# Patient Record
Sex: Female | Born: 1950 | Race: White | Hispanic: No | Smoking: Current every day smoker
Health system: Southern US, Community
[De-identification: ages and names within clinical notes are randomized; demographics above are authoritative.]

## PROBLEM LIST (undated history)

## (undated) DIAGNOSIS — F1910 Other psychoactive substance abuse, uncomplicated: Secondary | ICD-10-CM

## (undated) DIAGNOSIS — E785 Hyperlipidemia, unspecified: Secondary | ICD-10-CM

## (undated) DIAGNOSIS — I2510 Atherosclerotic heart disease of native coronary artery without angina pectoris: Secondary | ICD-10-CM

## (undated) DIAGNOSIS — I10 Essential (primary) hypertension: Secondary | ICD-10-CM

## (undated) DIAGNOSIS — Z720 Tobacco use: Secondary | ICD-10-CM

---

## 2002-02-11 ENCOUNTER — Emergency Department (HOSPITAL_COMMUNITY): Admission: EM | Admit: 2002-02-11 | Discharge: 2002-02-11 | Payer: Self-pay | Admitting: Emergency Medicine

## 2022-04-27 ENCOUNTER — Other Ambulatory Visit (HOSPITAL_COMMUNITY)
Admission: EM | Admit: 2022-04-27 | Discharge: 2022-05-02 | Disposition: A | Discharge status: Home / Self Care | Payer: Medicare Other | Attending: Psychiatry | Admitting: Psychiatry

## 2022-04-27 DIAGNOSIS — Z20822 Contact with and (suspected) exposure to covid-19: Secondary | ICD-10-CM | POA: Insufficient documentation

## 2022-04-27 DIAGNOSIS — F321 Major depressive disorder, single episode, moderate: Secondary | ICD-10-CM | POA: Insufficient documentation

## 2022-04-27 DIAGNOSIS — F1510 Other stimulant abuse, uncomplicated: Secondary | ICD-10-CM | POA: Diagnosis not present

## 2022-04-27 DIAGNOSIS — F1590 Other stimulant use, unspecified, uncomplicated: Secondary | ICD-10-CM | POA: Diagnosis not present

## 2022-04-27 DIAGNOSIS — F329 Major depressive disorder, single episode, unspecified: Secondary | ICD-10-CM | POA: Diagnosis present

## 2022-04-27 DIAGNOSIS — Z79899 Other long term (current) drug therapy: Secondary | ICD-10-CM | POA: Diagnosis not present

## 2022-04-27 LAB — CBC WITH DIFFERENTIAL/PLATELET
Abs Immature Granulocytes: 0.02 10*3/uL (ref 0.00–0.07)
Basophils Absolute: 0.1 10*3/uL (ref 0.0–0.1)
Basophils Relative: 1 %
Eosinophils Absolute: 0.2 10*3/uL (ref 0.0–0.5)
Eosinophils Relative: 2 %
HCT: 35.2 % — ABNORMAL LOW (ref 36.0–46.0)
Hemoglobin: 11.9 g/dL — ABNORMAL LOW (ref 12.0–15.0)
Immature Granulocytes: 0 %
Lymphocytes Relative: 31 %
Lymphs Abs: 3.1 10*3/uL (ref 0.7–4.0)
MCH: 30.1 pg (ref 26.0–34.0)
MCHC: 33.8 g/dL (ref 30.0–36.0)
MCV: 89.1 fL (ref 80.0–100.0)
Monocytes Absolute: 0.8 10*3/uL (ref 0.1–1.0)
Monocytes Relative: 8 %
Neutro Abs: 5.8 10*3/uL (ref 1.7–7.7)
Neutrophils Relative %: 58 %
Platelets: 394 10*3/uL (ref 150–400)
RBC: 3.95 MIL/uL (ref 3.87–5.11)
RDW: 13 % (ref 11.5–15.5)
WBC: 10 10*3/uL (ref 4.0–10.5)
nRBC: 0 % (ref 0.0–0.2)

## 2022-04-27 LAB — COMPREHENSIVE METABOLIC PANEL
ALT: 14 U/L (ref 0–44)
AST: 22 U/L (ref 15–41)
Albumin: 4.1 g/dL (ref 3.5–5.0)
Alkaline Phosphatase: 91 U/L (ref 38–126)
Anion gap: 7 (ref 5–15)
BUN: 23 mg/dL (ref 8–23)
CO2: 25 mmol/L (ref 22–32)
Calcium: 9.2 mg/dL (ref 8.9–10.3)
Chloride: 107 mmol/L (ref 98–111)
Creatinine, Ser: 0.63 mg/dL (ref 0.44–1.00)
GFR, Estimated: >60 mL/min (ref >60)
Glucose, Bld: 88 mg/dL (ref 70–99)
Potassium: 3.9 mmol/L (ref 3.5–5.1)
Sodium: 139 mmol/L (ref 135–145)
Total Bilirubin: <0.1 mg/dL — ABNORMAL LOW (ref 0.3–1.2)
Total Protein: 7 g/dL (ref 6.5–8.1)

## 2022-04-27 LAB — POCT URINE DRUG SCREEN - MANUAL ENTRY (I-SCREEN)
POC Amphetamine UR: POSITIVE — AB
POC Buprenorphine (BUP): NOT DETECTED
POC Cocaine UR: NOT DETECTED
POC Marijuana UR: POSITIVE — AB
POC Methadone UR: NOT DETECTED
POC Methamphetamine UR: POSITIVE — AB
POC Morphine: NOT DETECTED
POC Oxazepam (BZO): NOT DETECTED
POC Oxycodone UR: NOT DETECTED
POC Secobarbital (BAR): NOT DETECTED

## 2022-04-27 LAB — RESP PANEL BY RT-PCR (FLU A&B, COVID) ARPGX2
Influenza A by PCR: NEGATIVE
Influenza B by PCR: NEGATIVE
SARS Coronavirus 2 by RT PCR: NEGATIVE

## 2022-04-27 LAB — POC SARS CORONAVIRUS 2 AG: SARSCOV2ONAVIRUS 2 AG: NEGATIVE

## 2022-04-27 LAB — HEMOGLOBIN A1C
Hgb A1c MFr Bld: 5.7 % — ABNORMAL HIGH (ref 4.8–5.6)
Mean Plasma Glucose: 116.89 mg/dL

## 2022-04-27 LAB — ETHANOL: Alcohol, Ethyl (B): <10 mg/dL (ref <10)

## 2022-04-27 MED ORDER — TRAZODONE HCL 50 MG PO TABS
50.0000 mg | ORAL_TABLET | Freq: Every evening | ORAL | Status: DC | PRN
  Administered 2022-04-28: 50 mg via ORAL
  Filled 2022-04-27: qty 1

## 2022-04-27 MED ORDER — ALUM & MAG HYDROXIDE-SIMETH 200-200-20 MG/5ML PO SUSP: 30.0000 mL | ORAL | Status: DC | PRN

## 2022-04-27 MED ORDER — ACETAMINOPHEN 325 MG PO TABS
650.0000 mg | ORAL_TABLET | Freq: Four times a day (QID) | ORAL | Status: DC | PRN
  Administered 2022-04-28: 650 mg via ORAL
  Filled 2022-04-27: qty 2

## 2022-04-27 MED ORDER — MAGNESIUM HYDROXIDE 400 MG/5ML PO SUSP: 30.0000 mL | Freq: Every day | ORAL | Status: DC | PRN

## 2022-04-27 MED ORDER — HYDROXYZINE HCL 25 MG PO TABS
25.0000 mg | ORAL_TABLET | Freq: Three times a day (TID) | ORAL | Status: DC | PRN
  Administered 2022-04-28 – 2022-04-30 (×6): 25 mg via ORAL
  Filled 2022-04-27 (×6): qty 1
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
## 2022-04-27 NOTE — ED Notes (Signed)
Sandwich, cheez its, and OJ given per pt request. Pending PCR COVID to transfer to [facility].
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
## 2022-04-28 ENCOUNTER — Other Ambulatory Visit: Payer: Self-pay

## 2022-04-28 DIAGNOSIS — F321 Major depressive disorder, single episode, moderate: Secondary | ICD-10-CM | POA: Diagnosis not present

## 2022-04-28 LAB — URINALYSIS, ROUTINE W REFLEX MICROSCOPIC
Bilirubin Urine: NEGATIVE
Glucose, UA: NEGATIVE mg/dL
Hgb urine dipstick: NEGATIVE
Ketones, ur: NEGATIVE mg/dL
Leukocytes,Ua: NEGATIVE
Nitrite: NEGATIVE
Protein, ur: NEGATIVE mg/dL
Specific Gravity, Urine: 1.013 (ref 1.005–1.030)
pH: 6 (ref 5.0–8.0)

## 2022-04-28 LAB — LIPID PANEL
Cholesterol: 133 mg/dL (ref 0–200)
HDL: 91 mg/dL (ref >40)
LDL Cholesterol: 25 mg/dL (ref 0–99)
Total CHOL/HDL Ratio: 1.5 RATIO
Triglycerides: 86 mg/dL (ref <150)
VLDL: 17 mg/dL (ref 0–40)

## 2022-04-28 LAB — TSH: TSH: 0.724 u[IU]/mL (ref 0.350–4.500)

## 2022-04-28 MED ORDER — AMLODIPINE BESYLATE 5 MG PO TABS
5.0000 mg | ORAL_TABLET | Freq: Every day | ORAL | Status: DC
  Administered 2022-04-28 – 2022-04-29 (×2): 5 mg via ORAL
  Filled 2022-04-28 (×2): qty 1

## 2022-04-28 MED ORDER — NICOTINE 21 MG/24HR TD PT24
21.0000 mg | MEDICATED_PATCH | Freq: Every day | TRANSDERMAL | Status: DC
  Administered 2022-04-28 – 2022-05-02 (×5): 21 mg via TRANSDERMAL
  Filled 2022-04-28 (×5): qty 1

## 2022-04-28 MED ORDER — DULOXETINE HCL 30 MG PO CPEP
30.0000 mg | ORAL_CAPSULE | Freq: Every day | ORAL | Status: DC
  Administered 2022-04-28 – 2022-04-29 (×2): 30 mg via ORAL
  Filled 2022-04-28 (×2): qty 1

## 2022-04-28 NOTE — Progress Notes (Signed)
Patient awake and alert on unit.  Dysphoric and mildly irritable.  Patient is eating a snack and watching TV at present.  Seen by provider and will begin cymbalta.  Patient denies signs or symptoms of withdrawal.  She was given a cup for UA and is aware of the need for a sample.  Will monitor and provide a safe environment.

## 2022-04-28 NOTE — ED Notes (Signed)
Pt sitting in dining room watching TV. A&O x4, calm and cooperative. Denies SI/HI/AVH. Denies any current needs. No signs of acute distress noted. Will continue to monitor for safety.  ?

## 2022-04-28 NOTE — Progress Notes (Signed)
Patients B/P elevated with automatic reading = 202/110  Provider Vernard Gambles here and informed.  Retaken manually 170/98 provider aware and to order b/p medication for her.  Sh complained of a headache and 650mg  tylenol PO PRN given.  Patient denies blurred vision or chest pain.

## 2022-04-28 NOTE — ED Notes (Signed)
Pt asleep in bed. Respirations even and unlabored. Will continue to monitor for safety. ?

## 2022-04-28 NOTE — Progress Notes (Signed)
Received [place] this AM asleep in her bed, she woke up on her own, ate breakfast and returned to bed. Later she got up and requested to speak with the provider related hearing voices. She was given Vistaril related to anxiety.

## 2022-04-28 NOTE — ED Notes (Signed)
Pt admitted  to [facility] substance abuse, depression. Patient  denies SI,HI but endorses hearing voices of her son telling her things that mekes her feel bad. Patient was cooperative during the admission assessment. Skin assessment complete. Belongings inventoried. Patient oriented to unit and unit rules. Meal and drinks offered to patient.  Patient verbalized agreement to treatment plans. Patient verbally contracts for safety while hospitalized. Will monitor for safety.

## 2022-04-28 NOTE — ED Notes (Signed)
Pt BP 158/72, Pulse 90. Provider notified. Will continue to monitor for safety.
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
## 2022-04-28 NOTE — ED Notes (Addendum)
Patient refused group, gave her booklet to look over in her spare time, The title is Geophysicist/field seismologist with workbook, it covers how do we grow, Identifying needs, Choosing a valued Oriented Life, Self Esteem Coping Skills it all comes with work books.

## 2022-04-29 ENCOUNTER — Encounter (HOSPITAL_COMMUNITY): Payer: Self-pay

## 2022-04-29 ENCOUNTER — Encounter (HOSPITAL_COMMUNITY): Payer: Self-pay | Admitting: Physician Assistant

## 2022-04-29 DIAGNOSIS — F321 Major depressive disorder, single episode, moderate: Secondary | ICD-10-CM | POA: Diagnosis not present

## 2022-04-29 MED ORDER — TRAZODONE HCL 100 MG PO TABS
100.0000 mg | ORAL_TABLET | Freq: Every evening | ORAL | Status: DC | PRN
  Administered 2022-04-29 – 2022-05-01 (×3): 100 mg via ORAL
  Filled 2022-04-29 (×3): qty 1

## 2022-04-29 MED ORDER — AMLODIPINE BESYLATE 10 MG PO TABS
10.0000 mg | ORAL_TABLET | Freq: Every day | ORAL | Status: DC
  Administered 2022-04-30 – 2022-05-02 (×3): 10 mg via ORAL
  Filled 2022-04-29 (×3): qty 1

## 2022-04-29 MED ORDER — DULOXETINE HCL 60 MG PO CPEP
60.0000 mg | ORAL_CAPSULE | Freq: Every day | ORAL | Status: DC
  Administered 2022-04-30 – 2022-05-02 (×3): 60 mg via ORAL
  Filled 2022-04-29 (×3): qty 1

## 2022-04-29 MED ORDER — DULOXETINE HCL 30 MG PO CPEP
30.0000 mg | ORAL_CAPSULE | Freq: Once | ORAL | Status: AC
  Administered 2022-04-29: 30 mg via ORAL
  Filled 2022-04-29: qty 1

## 2022-04-29 MED ORDER — IBUPROFEN 600 MG PO TABS
600.0000 mg | ORAL_TABLET | Freq: Four times a day (QID) | ORAL | Status: DC | PRN
  Administered 2022-04-29 – 2022-04-30 (×3): 600 mg via ORAL
  Filled 2022-04-29 (×3): qty 1
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
## 2022-04-29 NOTE — BH IP Treatment Plan (Addendum)
Interdisciplinary Treatment and Diagnostic Plan Update  05/01/2022 Time of Session: 10:00AM  Shannon Burgess MRN: [id number]  Diagnosis:  Final diagnoses:  Current moderate episode of major depressive disorder without prior episode (HCC)  Methamphetamine use (HCC)     Current Medications:  Current Facility-Administered Medications  Medication Dose Route Frequency Provider Last Rate Last Admin   acetaminophen (TYLENOL) tablet 650 mg  650 mg Oral Q6H PRN Nwoko, Uchenna E, PA   650 mg at 04/28/22 1801   alum & mag hydroxide-simeth (MAALOX/MYLANTA) 200-200-20 MG/5ML suspension 30 mL  30 mL Oral Q4H PRN Nwoko, Uchenna E, PA       amLODipine (NORVASC) tablet 10 mg  10 mg Oral Daily Vernard Gambles H, NP   10 mg at 05/01/22 0855   DULoxetine (CYMBALTA) DR capsule 60 mg  60 mg Oral Daily Ardis Hughs, NP   60 mg at 05/01/22 0856   hydrOXYzine (ATARAX) tablet 25 mg  25 mg Oral TID PRN Nwoko, Uchenna E, PA   25 mg at 04/30/22 2107   ibuprofen (ADVIL) tablet 600 mg  600 mg Oral Q6H PRN Ardis Hughs, NP   600 mg at 04/30/22 2107   magnesium hydroxide (MILK OF MAGNESIA) suspension 30 mL  30 mL Oral Daily PRN Nwoko, Uchenna E, PA       nicotine (NICODERM CQ - dosed in mg/24 hours) patch 21 mg  21 mg Transdermal Daily Vernard Gambles H, NP   21 mg at 05/01/22 0900   risperiDONE (RISPERDAL) tablet 2 mg  2 mg Oral BID Mariel Craft, MD       traZODone (DESYREL) tablet 100 mg  100 mg Oral QHS PRN Ardis Hughs, NP   100 mg at 04/30/22 2104   No current outpatient medications on file.   PTA Medications: Prior to Admission medications   Not on File    Patient Stressors: Financial difficulties   Substance abuse   Other: Homeless    Patient Strengths: Supportive family/friends   Treatment Modalities: Medication Management, Group therapy, Case management,  1 to 1 session with clinician, Psychoeducation, Recreational therapy.   Physician Treatment Plan for Primary and Secondary  Diagnosis:  Final diagnoses:  Current moderate episode of major depressive disorder without prior episode (HCC)  Methamphetamine use (HCC)   Long Term Goal(s): Improvement in symptoms so as ready for discharge had resources for outpatient psychiatry and therapy.  Patient would also benefit from receiving resources for substance abuse treatment.  Short Term Goals: Ability to identify changes in lifestyle to reduce recurrence of condition will improve Ability to verbalize feelings will improve Ability to demonstrate self-control will improve Ability to identify and develop effective coping behaviors will improve Ability to maintain clinical measurements within normal limits will improve Compliance with prescribed medications will improve Ability to identify triggers associated with substance abuse/mental health issues will improve  Medication Management: Evaluate patient's response, side effects, and tolerance of medication regimen.  Therapeutic Interventions: 1 to 1 sessions, Unit Group sessions and Medication administration.  Evaluation of Outcomes: Progressing  RN Treatment Plan for Primary Diagnosis:  Final diagnoses:  Current moderate episode of major depressive disorder without prior episode (HCC)  Methamphetamine use (HCC)    Long Term Goal(s): Knowledge of disease and therapeutic regimen to maintain health will improve  Short Term Goals: Ability to identify and develop effective coping behaviors will improve  Medication Management: RN will administer medications as ordered by provider, will assess and evaluate patient's response and provide education  to patient for prescribed medication. RN will report any adverse and/or side effects to prescribing provider.  Therapeutic Interventions: 1 on 1 counseling sessions, Psychoeducation, Medication administration, Evaluate responses to treatment, Monitor vital signs and CBGs as ordered, Perform/monitor CIWA, COWS, AIMS and Fall Risk  screenings as ordered, Perform wound care treatments as ordered.  Evaluation of Outcomes: Progressing   LCSW Treatment Plan for Primary Diagnosis:  Final diagnoses:  Current moderate episode of major depressive disorder without prior episode (HCC)  Methamphetamine use (HCC)    Long Term Goal(s): Safe transition to appropriate next level of care at discharge, Engage patient in therapeutic group addressing interpersonal concerns.  Short Term Goals: Engage patient in aftercare planning with referrals and resources  Therapeutic Interventions: Assess for all discharge needs, 1 to 1 time with Social worker, Explore available resources and support systems, Assess for adequacy in community support network, Educate family and significant other(s) on suicide prevention, Complete Psychosocial Assessment, Interpersonal group therapy.  Evaluation of Outcomes: Adequate for Discharge   Progress in Treatment: Attending groups: No. Participating in groups: No. Taking medication as prescribed: Yes. Toleration medication: Yes. Family/Significant other contact made: No, will contact:  No one Patient understands diagnosis: Yes. Discussing patient identified problems/goals with staff: Yes. Medical problems stabilized or resolved: Yes. Denies suicidal/homicidal ideation: Yes. Issues/concerns per patient self-inventory: No. Other: None  New problem(s) identified: None   New Short Term/Long Term Goal(s): To be determined  Patient Goals:  "I dont really know right now. I just want to get clean and get this stuff out of me"  Discharge Plan or Barriers: To be determined; LCSW will continue to follow and assess.   Reason for Continuation of Hospitalization: Medication stabilization Withdrawal symptoms  Estimated Length of Stay: 3-5 days  Last 3 [place] Suicide Severity Risk Score: Flowsheet Row ED from 04/27/2022 in [facility]  C-SSRS RISK CATEGORY No Risk        Last [facility] 2/9 Scores:    04/28/2022    1:05 PM  Depression screen PHQ 2/9  Decreased Interest 3  Down, Depressed, Hopeless 3  PHQ - 2 Score 6  Altered sleeping 3  Tired, decreased energy 3  Change in appetite 3  Feeling bad or failure about yourself  3  Trouble concentrating 3  Moving slowly or fidgety/restless 3  Suicidal thoughts 0  PHQ-9 Score 24  Difficult doing work/chores Extremely dIfficult    Scribe for Treatment Team: Maeola Sarah, LCSW 05/01/2022 4:04 PM

## 2022-04-29 NOTE — Progress Notes (Signed)
Patient is restless with muscle aches and irritable mood.  Patient seeking additional medication to manage symptoms.  Provider met with patient and discussed options.  No new orders as of this time.  Patient has limited insight into addiction and recovery.  Resistant to learning about above.  Patient denies avh shi at this time.  Will monitor and provide safe environment.

## 2022-04-29 NOTE — ED Provider Notes (Signed)
Behavioral Health Progress Note  Date and Time: 04/29/2022 9:14 PM Name: Shannon Burgess MRN:  [id number]  Subjective:    Shannon Burgess 72 y.o., female patient presented to [facility] with increased depression and methamphetamine use.  She was admitted to the facility based crisis unit.    Shannon Burgess, 71 y.o., female patient seen face to face by this provider, consulted with Dr. Lucianne Muss; and chart reviewed on 04/29/22.  Upon assessment UDS positive for amphetamines, methamphetamines, and marijuana.  BAL negative.  On today's reevaluation patient is observed walking to the day room.  She is seen eating.  She is alert/oriented and cooperative.  Her speech and behavior is normal.  Reports she only slept 2-3 hours last night and is requesting medications for sleep.  Reports she took the trazodone last night but it was not effective.  She denies any concerns with appetite.  She continues to endorse depression with a congruent affect.  She denies SI/HI/AVH.  She continues to hear conversations replay in her mind that she and her son had, she calls these hallucinations.  She endorses withdrawal symptoms which include, chills, hot flashes, restlessness, and muscle/joint pain.  She rates her pain as an 8 out of 10.  Discussed increasing Cymbalta from 30 mg to 60 mg/day, added ibuprofen 600 mg every 6 hours as needed for pain, increase trazodone to 100 mg nightly as needed and increased Norvasc to 10 mg daily for elevated blood pressure.  Educated patient on medications and she is in agreement.    Diagnosis:  Final diagnoses:  Current moderate episode of major depressive disorder without prior episode (HCC)  Methamphetamine use (HCC)    Total Time spent with patient: 30 minutes  Past Psychiatric History: See H&P Past Medical History: No past medical history on file. The histories are not reviewed yet. Please review them in the "History" navigator section and refresh this SmartLink. Family History: No family  history on file. Family Psychiatric  History: See H&P Social History:  Social History   Substance and Sexual Activity  Alcohol Use Not on file     Social History   Substance and Sexual Activity  Drug Use Not on file    Social History   Socioeconomic History   Marital status: Divorced    Spouse name: Not on file   Number of children: Not on file   Years of education: Not on file   Highest education level: Not on file  Occupational History   Not on file  Tobacco Use   Smoking status: Not on file   Smokeless tobacco: Not on file  Substance and Sexual Activity   Alcohol use: Not on file   Drug use: Not on file   Sexual activity: Not on file  Other Topics Concern   Not on file  Social History Narrative   Not on file   Social Determinants of Health   Financial Resource Strain: Not on file  Food Insecurity: Not on file  Transportation Needs: Not on file  Physical Activity: Not on file  Stress: Not on file  Social Connections: Not on file   SDOH:  SDOH Screenings   Alcohol Screen: Not on file  Depression (PHQ2-9): Medium Risk   PHQ-2 Score: 24  Financial Resource Strain: Not on file  Food Insecurity: Not on file  Housing: Not on file  Physical Activity: Not on file  Social Connections: Not on file  Stress: Not on file  Tobacco Use: Not on file  Transportation Needs:  Not on file   Additional Social History:    Pain Medications: See MAR Prescriptions: See MAR Over the Counter: See MAR History of alcohol / drug use?: Yes Name of Substance 1: Amphetamines. 1 - Age of First Use: UTA 1 - Amount (size/oz): Pt reports, "I dont know." 1 - Frequency: Ongoing. 1 - Duration: Ongoing. 1 - Last Use / Amount: Yesterday. 1 - Method of Aquiring: Purchase. 1- Route of Use: UTA Name of Substance 2: Marijuana. 2 - Age of First Use: UTA 2 - Amount (size/oz): UTA 2 - Frequency: Ongoing. 2 - Duration: Daily. 2 - Last Use / Amount: Daily. 2 - Method of Aquiring:  Purchase. 2 - Route of Substance Use: Oral                Sleep: Poor  Appetite:  Fair  Current Medications:  Current Facility-Administered Medications  Medication Dose Route Frequency Provider Last Rate Last Admin   acetaminophen (TYLENOL) tablet 650 mg  650 mg Oral Q6H PRN Nwoko, Uchenna E, PA   650 mg at 04/28/22 1801   alum & mag hydroxide-simeth (MAALOX/MYLANTA) 200-200-20 MG/5ML suspension 30 mL  30 mL Oral Q4H PRN Nwoko, Uchenna E, PA       [START ON 04/30/2022] amLODipine (NORVASC) tablet 10 mg  10 mg Oral Daily Ardis Hughs, NP       [START ON 04/30/2022] DULoxetine (CYMBALTA) DR capsule 60 mg  60 mg Oral Daily Ardis Hughs, NP       hydrOXYzine (ATARAX) tablet 25 mg  25 mg Oral TID PRN Nwoko, Uchenna E, PA   25 mg at 04/29/22 0931   ibuprofen (ADVIL) tablet 600 mg  600 mg Oral Q6H PRN Ardis Hughs, NP   600 mg at 04/29/22 1314   magnesium hydroxide (MILK OF MAGNESIA) suspension 30 mL  30 mL Oral Daily PRN Nwoko, Uchenna E, PA       nicotine (NICODERM CQ - dosed in mg/24 hours) patch 21 mg  21 mg Transdermal Daily Vernard Gambles H, NP   21 mg at 04/29/22 0931   traZODone (DESYREL) tablet 100 mg  100 mg Oral QHS PRN Ardis Hughs, NP       No current outpatient medications on file.    Labs  Lab Results:  Admission on 04/27/2022  Component Date Value Ref Range Status   SARS Coronavirus 2 by RT PCR 04/27/2022 NEGATIVE  NEGATIVE Final   Comment: (NOTE) SARS-CoV-2 target nucleic acids are NOT DETECTED.  The SARS-CoV-2 RNA is generally detectable in upper respiratory specimens during the acute phase of infection. The lowest concentration of SARS-CoV-2 viral copies this assay can detect is 138 copies/mL. A negative result does not preclude SARS-Cov-2 infection and should not be used as the sole basis for treatment or other patient management decisions. A negative result may occur with  improper specimen collection/handling, submission of specimen  other than nasopharyngeal swab, presence of viral mutation(s) within the areas targeted by this assay, and inadequate number of viral copies(<138 copies/mL). A negative result must be combined with clinical observations, patient history, and epidemiological information. The expected result is Negative.  Fact Sheet for Patients:  BloggerCourse.com  Fact Sheet for Healthcare Providers:  SeriousBroker.it  This test is no                          t yet approved or cleared by the [place] and  has been authorized  for detection and/or diagnosis of SARS-CoV-2 by FDA under an Emergency Use Authorization (EUA). This EUA will remain  in effect (meaning this test can be used) for the duration of the COVID-19 declaration under Section 564(b)(1) of the Act, 21 U.S.C.section 360bbb-3(b)(1), unless the authorization is terminated  or revoked sooner.       Influenza A by PCR 04/27/2022 NEGATIVE  NEGATIVE Final   Influenza B by PCR 04/27/2022 NEGATIVE  NEGATIVE Final   Comment: (NOTE) The Xpert Xpress SARS-CoV-2/FLU/RSV plus assay is intended as an aid in the diagnosis of influenza from Nasopharyngeal swab specimens and should not be used as a sole basis for treatment. Nasal washings and aspirates are unacceptable for Xpert Xpress SARS-CoV-2/FLU/RSV testing.  Fact Sheet for Patients: BloggerCourse.com  Fact Sheet for Healthcare Providers: SeriousBroker.it  This test is not yet approved or cleared by the [place] FDA and has been authorized for detection and/or diagnosis of SARS-CoV-2 by FDA under an Emergency Use Authorization (EUA). This EUA will remain in effect (meaning this test can be used) for the duration of the COVID-19 declaration under Section 564(b)(1) of the Act, 21 U.S.C. section 360bbb-3(b)(1), unless the authorization is terminated or revoked.  Performed at  [facility], [address], [place], [place] [address]    WBC 04/27/2022 10.0  4.0 - 10.5 K/uL Final   RBC 04/27/2022 3.95  3.87 - 5.11 MIL/uL Final   Hemoglobin 04/27/2022 11.9 (L)  12.0 - 15.0 g/dL Final   HCT 60/45/4098 35.2 (L)  36.0 - 46.0 % Final   MCV 04/27/2022 89.1  80.0 - 100.0 fL Final   MCH 04/27/2022 30.1  26.0 - 34.0 pg Final   MCHC 04/27/2022 33.8  30.0 - 36.0 g/dL Final   RDW 11/91/4782 13.0  11.5 - 15.5 % Final   Platelets 04/27/2022 394  150 - 400 K/uL Final   nRBC 04/27/2022 0.0  0.0 - 0.2 % Final   Neutrophils Relative % 04/27/2022 58  % Final   Neutro Abs 04/27/2022 5.8  1.7 - 7.7 K/uL Final   Lymphocytes Relative 04/27/2022 31  % Final   Lymphs Abs 04/27/2022 3.1  0.7 - 4.0 K/uL Final   Monocytes Relative 04/27/2022 8  % Final   Monocytes Absolute 04/27/2022 0.8  0.1 - 1.0 K/uL Final   Eosinophils Relative 04/27/2022 2  % Final   Eosinophils Absolute 04/27/2022 0.2  0.0 - 0.5 K/uL Final   Basophils Relative 04/27/2022 1  % Final   Basophils Absolute 04/27/2022 0.1  0.0 - 0.1 K/uL Final   Immature Granulocytes 04/27/2022 0  % Final   Abs Immature Granulocytes 04/27/2022 0.02  0.00 - 0.07 K/uL Final   Performed at [facility], [address], [place], [place] [address]   Sodium 04/27/2022 139  135 - 145 mmol/L Final   Potassium 04/27/2022 3.9  3.5 - 5.1 mmol/L Final   Chloride 04/27/2022 107  98 - 111 mmol/L Final   CO2 04/27/2022 25  22 - 32 mmol/L Final   Glucose, Bld 04/27/2022 88  70 - 99 mg/dL Final   Glucose reference range applies only to samples taken after fasting for at least 8 hours.   BUN 04/27/2022 23  8 - 23 mg/dL Final   Creatinine, Ser 04/27/2022 0.63  0.44 - 1.00 mg/dL Final   Calcium 30/86/5784 9.2  8.9 - 10.3 mg/dL Final   Total Protein 69/62/9528 7.0  6.5 - 8.1 g/dL Final   Albumin 41/32/4401 4.1  3.5 - 5.0 g/dL Final   AST 16/09/9603 22  15 - 41 U/L Final   ALT 04/27/2022 14  0 - 44 U/L Final   Alkaline Phosphatase  04/27/2022 91  38 - 126 U/L Final   Total Bilirubin 04/27/2022 <0.1 (L)  0.3 - 1.2 mg/dL Final   GFR, Estimated 04/27/2022 >60  >60 mL/min Final   Comment: (NOTE) Calculated using the CKD-EPI Creatinine Equation (2021)    Anion gap 04/27/2022 7  5 - 15 Final   Performed at [facility], [address] [place], [place] [address]   Hgb A1c MFr Bld 04/27/2022 5.7 (H)  4.8 - 5.6 % Final   Comment: (NOTE) Pre diabetes:          5.7%-6.4%  Diabetes:              >6.4%  Glycemic control for   <7.0% adults with diabetes    Mean Plasma Glucose 04/27/2022 116.89  mg/dL Final   Performed at [facility], [address] [place], [place] [address]   Alcohol, Ethyl (B) 04/27/2022 <10  <10 mg/dL Final   Comment: (NOTE) Lowest detectable limit for serum alcohol is 10 mg/dL.  For medical purposes only. Performed at [facility], [address] [place], [place] [address]    Cholesterol 04/27/2022 133  0 - 200 mg/dL Final   Triglycerides 62/13/0865 86  <150 mg/dL Final   HDL 78/46/9629 91  >40 mg/dL Final   Total CHOL/HDL Ratio 04/27/2022 1.5  RATIO Final   VLDL 04/27/2022 17  0 - 40 mg/dL Final   LDL Cholesterol 04/27/2022 25  0 - 99 mg/dL Final   Comment:        Total Cholesterol/HDL:CHD Risk Coronary Heart Disease Risk Table                     Men   Women  1/2 Average Risk   3.4   3.3  Average Risk       5.0   4.4  2 X Average Risk   9.6   7.1  3 X Average Risk  23.4   11.0        Use the calculated Patient Ratio above and the CHD Risk Table to determine the patient's CHD Risk.        ATP III CLASSIFICATION (LDL):  <100     mg/dL   Optimal  528-413  mg/dL   Near or Above                    Optimal  130-159  mg/dL   Borderline  244-010  mg/dL   High  >272     mg/dL   Very High Performed at [facility], [address], [place], [place] [address]    TSH 04/27/2022 0.724  0.350 - 4.500 uIU/mL Final   Comment: Performed by a 3rd Generation assay with a  functional sensitivity of <=0.01 uIU/mL. Performed at [facility], [address] [place], [place] [address]    POC Amphetamine UR 04/27/2022 Positive (A)  NONE DETECTED (Cut Off Level 1000 ng/mL) Final   POC Secobarbital (BAR) 04/27/2022 None Detected  NONE DETECTED (Cut Off Level 300 ng/mL) Final   POC Buprenorphine (BUP) 04/27/2022 None Detected  NONE DETECTED (Cut Off Level 10 ng/mL) Final   POC Oxazepam (BZO) 04/27/2022 None Detected  NONE DETECTED (Cut Off Level 300 ng/mL) Final  POC Cocaine UR 04/27/2022 None Detected  NONE DETECTED (Cut Off Level 300 ng/mL) Final   POC Methamphetamine UR 04/27/2022 Positive (A)  NONE DETECTED (Cut Off Level 1000 ng/mL) Final   POC Morphine 04/27/2022 None Detected  NONE DETECTED (Cut Off Level 300 ng/mL) Final   POC Methadone UR 04/27/2022 None Detected  NONE DETECTED (Cut Off Level 300 ng/mL) Final   POC Oxycodone UR 04/27/2022 None Detected  NONE DETECTED (Cut Off Level 100 ng/mL) Final   POC Marijuana UR 04/27/2022 Positive (A)  NONE DETECTED (Cut Off Level 50 ng/mL) Final   SARSCOV2ONAVIRUS 2 AG 04/27/2022 NEGATIVE  NEGATIVE Final   Comment: (NOTE) SARS-CoV-2 antigen NOT DETECTED.   Negative results are presumptive.  Negative results do not preclude SARS-CoV-2 infection and should not be used as the sole basis for treatment or other patient management decisions, including infection  control decisions, particularly in the presence of clinical signs and  symptoms consistent with COVID-19, or in those who have been in contact with the virus.  Negative results must be combined with clinical observations, patient history, and epidemiological information. The expected result is Negative.  Fact Sheet for Patients: https://www.jennings-kim.com/  Fact Sheet for Healthcare Providers: https://alexander-rogers.biz/  This test is not yet approved or cleared by the [place] FDA and  has been authorized for detection  and/or diagnosis of SARS-CoV-2 by FDA under an Emergency Use Authorization (EUA).  This EUA will remain in effect (meaning this test can be used) for the duration of  the COV                          ID-19 declaration under Section 564(b)(1) of the Act, 21 U.S.C. section 360bbb-3(b)(1), unless the authorization is terminated or revoked sooner.     Color, Urine 04/28/2022 STRAW (A)  YELLOW Final   APPearance 04/28/2022 CLEAR  CLEAR Final   Specific Gravity, Urine 04/28/2022 1.013  1.005 - 1.030 Final   pH 04/28/2022 6.0  5.0 - 8.0 Final   Glucose, UA 04/28/2022 NEGATIVE  NEGATIVE mg/dL Final   Hgb urine dipstick 04/28/2022 NEGATIVE  NEGATIVE Final   Bilirubin Urine 04/28/2022 NEGATIVE  NEGATIVE Final   Ketones, ur 04/28/2022 NEGATIVE  NEGATIVE mg/dL Final   Protein, ur 10/93/2355 NEGATIVE  NEGATIVE mg/dL Final   Nitrite 73/22/0254 NEGATIVE  NEGATIVE Final   Leukocytes,Ua 04/28/2022 NEGATIVE  NEGATIVE Final   Performed at [facility], [address], [place], [place] [address]    Blood Alcohol level:  Lab Results  Component Value Date   ETH <10 04/27/2022    Metabolic Disorder Labs: Lab Results  Component Value Date   HGBA1C 5.7 (H) 04/27/2022   MPG 116.89 04/27/2022   No results found for: PROLACTIN Lab Results  Component Value Date   CHOL 133 04/27/2022   TRIG 86 04/27/2022   HDL 91 04/27/2022   CHOLHDL 1.5 04/27/2022   VLDL 17 04/27/2022   LDLCALC 25 04/27/2022    Therapeutic Lab Levels: No results found for: LITHIUM No results found for: VALPROATE No components found for:  CBMZ  Physical Findings   PHQ2-9    Flowsheet Row ED from 04/27/2022 in [facility]  PHQ-2 Total Score 6  PHQ-9 Total Score 24      Flowsheet Row ED from 04/27/2022 in [facility]  C-SSRS RISK CATEGORY Error: Q3, 4, or 5 should not be populated when Q2 is No  Musculoskeletal  Strength & Muscle Tone: within  normal limits Gait & Station: normal Patient leans: N/A  Psychiatric Specialty Exam  Presentation  General Appearance: Appropriate for Environment; Casual  Eye Contact:Good  Speech:Clear and Coherent; Normal Rate  Speech Volume:Normal  Handedness:Right   Mood and Affect  Mood:Anxious; Depressed  Affect:Congruent   Thought Process  Thought Processes:Coherent  Descriptions of Associations:Intact  Orientation:Full (Time, Place and Person)  Thought Content:Logical     Hallucinations:Hallucinations: Auditory Description of Auditory Hallucinations: hears sons voice and conversations replaying they have had  Ideas of Reference:None  Suicidal Thoughts:Suicidal Thoughts: No  Homicidal Thoughts:Homicidal Thoughts: No   Sensorium  Memory:Immediate Good; Remote Good; Recent Good  Judgment:Fair  Insight:Fair   Executive Functions  Concentration:Good  Attention Span:Good  Recall:Good  Fund of Knowledge:Good  Language:Good   Psychomotor Activity  Psychomotor Activity:Psychomotor Activity: Normal  Assets  Assets:Communication Skills; Desire for Improvement; Financial Resources/Insurance; Leisure Time; Physical Health; Resilience   Sleep  Sleep:Sleep: Poor Number of Hours of Sleep: 2  No data recorded  Physical Exam  Physical Exam Vitals and nursing note reviewed.  Constitutional:      General: She is not in acute distress.    Appearance: Normal appearance. She is not ill-appearing.  HENT:     Head: Normocephalic.  Eyes:     General:        Right eye: No discharge.        Left eye: No discharge.     Conjunctiva/sclera: Conjunctivae normal.  Cardiovascular:     Rate and Rhythm: Normal rate.  Pulmonary:     Effort: Pulmonary effort is normal.  Musculoskeletal:        General: Normal range of motion.     Cervical back: Normal range of motion.  Skin:    Capillary Refill: Capillary refill takes more than 3 seconds.     Coloration: Skin is not  jaundiced or pale.  Neurological:     Mental Status: She is alert and oriented to person, place, and time.  Psychiatric:        Attention and Perception: Attention normal. She perceives auditory hallucinations.        Mood and Affect: Mood is anxious and depressed. Affect is tearful.        Speech: Speech normal.        Behavior: Behavior is cooperative.        Thought Content: Thought content normal.        Cognition and Memory: Cognition normal.        Judgment: Judgment is impulsive.   Review of Systems  Constitutional: Negative.   HENT: Negative.    Eyes: Negative.   Respiratory: Negative.    Cardiovascular: Negative.   Musculoskeletal: Negative.   Skin: Negative.   Neurological: Negative.   Psychiatric/Behavioral:  Positive for depression, hallucinations and substance abuse. The patient is nervous/anxious.   Blood pressure (!) 162/98, pulse 93, temperature 99.4 F (37.4 C), temperature source Oral, resp. rate 18, SpO2 100 %. There is no height or weight on file to calculate BMI.  Treatment Plan Summary: Disposition: Patient continues to meet criteria for treatment in the [facility].  We will continue to have daily contact with patient to assess and evaluate symptoms and progress in treatment and Medication management.   Increased  Cymbalta from 30 mg to 60 mg/day, added ibuprofen 600 mg every 6 hours as needed for pain, increase trazodone to 100 mg nightly as needed and increased Norvasc to 10 mg daily for  elevated blood pressure.  Educated patient on medications and she is in agreement.  Ardis Hughsarolyn H Chardonnay Holzmann, NP 04/29/2022 9:14 PM

## 2022-04-29 NOTE — ED Notes (Signed)
Patient is resting in bed. She states that she is very worried about where she will go when she leaves here. She does not have a home and has no money. She hope she can go to a facility that accepts medicare. Will continue to monitor for safety.

## 2022-04-29 NOTE — ED Notes (Signed)
Pt asleep in bed. Respirations even and unlabored. Will continue to monitor for safety. ?

## 2022-04-29 NOTE — ED Notes (Signed)
Notified pt that breakfast is ready 

## 2022-04-30 DIAGNOSIS — F321 Major depressive disorder, single episode, moderate: Secondary | ICD-10-CM | POA: Diagnosis not present

## 2022-04-30 MED ORDER — RISPERIDONE 1 MG PO TABS
1.0000 mg | ORAL_TABLET | Freq: Two times a day (BID) | ORAL | Status: DC
Start: 2022-04-30 — End: 2022-05-01
  Administered 2022-04-30 – 2022-05-01 (×3): 1 mg via ORAL
  Filled 2022-04-30 (×3): qty 1

## 2022-04-30 NOTE — ED Notes (Signed)
Received patient this PM. Patient is sleeping in her bed. Patient respirations are even and unlabored. Will continue to monitor for safety.  

## 2022-04-30 NOTE — ED Notes (Signed)
Patient continues to rest with no sxs of distress - will continue to monitor for safety ?

## 2022-04-30 NOTE — ED Notes (Signed)
Pt asked for cereal, milk,cookie and peanut butter given

## 2022-04-30 NOTE — ED Notes (Signed)
Patient resting quietly with eyes closed. No S/S of distress, respirations even and unlabored. Will continue to monitor for safety.

## 2022-04-30 NOTE — Progress Notes (Signed)
Shannon Burgess woke up for dinner, ate and received her medications per order.

## 2022-04-30 NOTE — Progress Notes (Addendum)
Received Shannon Burgess this AM asleep in her room, she was awaken for breakfast and medication. She returned to her room after each event. She endorsed feeling anxious and depressed with intervals of panic attacks. She was medicated with Vistaril. She denied feeling suicidal.

## 2022-04-30 NOTE — ED Provider Notes (Signed)
Behavioral Health Progress Note  Date and Time: 04/30/2022 11:33 AM Name: Shannon Burgess MRN:  [id number]  Subjective:    Shannon Burgess 71 y.o., female patient presented to [facility] with increased depression and methamphetamine use.  She was admitted to the facility based crisis unit.    Shannon Burgess, 71 y.o., female patient seen face to face by this provider.  Chart is reviewed on 04/30/22. Upon initial assessment UDS positive for amphetamines, methamphetamines, and marijuana.  BAL negative.  On today's reevaluation patient is observed in her room.  She remains in bed. She is alert/oriented and cooperative.  Her behavior is normal.  She endorses poor sleep but good appetite.  She describes body aches and headaches from methamphetamine withdrawal. She endorses withdrawal symptoms which include, chills, hot flashes, restlessness, and muscle/joint pain.  She rates her pain as an 8 out of 10.She continues to endorse depression with a congruent affect.  She denies SI/HI/AVH.  She continues to hear conversations replay in her mind that she and her son had, she calls these hallucinations.  She states that she continues to feel depressed, but is optimistic that things will improve.  She continues to endorse auditory hallucinations of her son telling her he hates her and to go to the shelter.  She states that this worsens her depression, as she was "helping him pay off his bills, and I was eating out of dumpsters."  Patient endorses 70 pound weight loss during this time.   Medication changes on 04/29/2022: Cymbalta from 30 mg to 60 mg/day, added ibuprofen 600 mg every 6 hours as needed for pain, increase trazodone to 100 mg nightly as needed and increased Norvasc to 10 mg daily for elevated blood pressure.   04/30/2022: Discussed with patient starting Risperdal 1 mg twice daily for auditory hallucinations.  Patient is educated on R/B/SE and is agreeable with treatment.    Diagnosis:  Final diagnoses:  Current  moderate episode of major depressive disorder without prior episode (HCC)  Methamphetamine use (HCC)   Total Time Spent in Direct Patient Care:  I personally spent 35 minutes on the unit in direct patient care. The direct patient care time included face-to-face time with the patient, reviewing the patient's chart, communicating with other professionals, and coordinating care. Greater than 50% of this time was spent in counseling or coordinating care with the patient regarding goals of hospitalization, psycho-education, and discharge planning needs.  Past Psychiatric History: See H&P Past Medical History: No past medical history on file. The histories are not reviewed yet. Please review them in the "History" navigator section and refresh this SmartLink. Family History: No family history on file. Family Psychiatric  History: See H&P Social History:  Social History   Substance and Sexual Activity  Alcohol Use Not on file     Social History   Substance and Sexual Activity  Drug Use Not on file    Social History   Socioeconomic History   Marital status: Divorced    Spouse name: Not on file   Number of children: Not on file   Years of education: Not on file   Highest education level: Not on file  Occupational History   Not on file  Tobacco Use   Smoking status: Not on file   Smokeless tobacco: Not on file  Substance and Sexual Activity   Alcohol use: Not on file   Drug use: Not on file   Sexual activity: Not on file  Other Topics Concern   Not  on file  Social History Narrative   Not on file   Social Determinants of Health   Financial Resource Strain: Not on file  Food Insecurity: Not on file  Transportation Needs: Not on file  Physical Activity: Not on file  Stress: Not on file  Social Connections: Not on file   SDOH:  SDOH Screenings   Alcohol Screen: Not on file  Depression (PHQ2-9): Medium Risk   PHQ-2 Score: 24  Financial Resource Strain: Not on file  Food  Insecurity: Not on file  Housing: Not on file  Physical Activity: Not on file  Social Connections: Not on file  Stress: Not on file  Tobacco Use: Not on file  Transportation Needs: Not on file   Additional Social History:    Pain Medications: See MAR Prescriptions: See MAR Over the Counter: See MAR History of alcohol / drug use?: Yes Name of Substance 1: Amphetamines. 1 - Age of First Use: UTA 1 - Amount (size/oz): Pt reports, "I dont know." 1 - Frequency: Ongoing. 1 - Duration: Ongoing. 1 - Last Use / Amount: Yesterday. 1 - Method of Aquiring: Purchase. 1- Route of Use: UTA Name of Substance 2: Marijuana. 2 - Age of First Use: UTA 2 - Amount (size/oz): UTA 2 - Frequency: Ongoing. 2 - Duration: Daily. 2 - Last Use / Amount: Daily. 2 - Method of Aquiring: Purchase. 2 - Route of Substance Use: Oral                Sleep: Poor  Appetite:  Fair  Current Medications:  Current Facility-Administered Medications  Medication Dose Route Frequency Provider Last Rate Last Admin   acetaminophen (TYLENOL) tablet 650 mg  650 mg Oral Q6H PRN Nwoko, Uchenna E, PA   650 mg at 04/28/22 1801   alum & mag hydroxide-simeth (MAALOX/MYLANTA) 200-200-20 MG/5ML suspension 30 mL  30 mL Oral Q4H PRN Nwoko, Uchenna E, PA       amLODipine (NORVASC) tablet 10 mg  10 mg Oral Daily Vernard Gambles H, NP   10 mg at 04/30/22 0920   DULoxetine (CYMBALTA) DR capsule 60 mg  60 mg Oral Daily Ardis Hughs, NP   60 mg at 04/30/22 0920   hydrOXYzine (ATARAX) tablet 25 mg  25 mg Oral TID PRN Nwoko, Uchenna E, PA   25 mg at 04/29/22 2119   ibuprofen (ADVIL) tablet 600 mg  600 mg Oral Q6H PRN Ardis Hughs, NP   600 mg at 04/29/22 2118   magnesium hydroxide (MILK OF MAGNESIA) suspension 30 mL  30 mL Oral Daily PRN Nwoko, Uchenna E, PA       nicotine (NICODERM CQ - dosed in mg/24 hours) patch 21 mg  21 mg Transdermal Daily Vernard Gambles H, NP   21 mg at 04/30/22 0919   risperiDONE (RISPERDAL)  tablet 1 mg  1 mg Oral BID Mariel Craft, MD       traZODone (DESYREL) tablet 100 mg  100 mg Oral QHS PRN Ardis Hughs, NP   100 mg at 04/29/22 2118   No current outpatient medications on file.    Labs  Lab Results:  Admission on 04/27/2022  Component Date Value Ref Range Status   SARS Coronavirus 2 by RT PCR 04/27/2022 NEGATIVE  NEGATIVE Final   Comment: (NOTE) SARS-CoV-2 target nucleic acids are NOT DETECTED.  The SARS-CoV-2 RNA is generally detectable in upper respiratory specimens during the acute phase of infection. The lowest concentration of SARS-CoV-2 viral copies this  assay can detect is 138 copies/mL. A negative result does not preclude SARS-Cov-2 infection and should not be used as the sole basis for treatment or other patient management decisions. A negative result may occur with  improper specimen collection/handling, submission of specimen other than nasopharyngeal swab, presence of viral mutation(s) within the areas targeted by this assay, and inadequate number of viral copies(<138 copies/mL). A negative result must be combined with clinical observations, patient history, and epidemiological information. The expected result is Negative.  Fact Sheet for Patients:  BloggerCourse.comhttps://www.fda.gov/media/152166/download  Fact Sheet for Healthcare Providers:  SeriousBroker.ithttps://www.fda.gov/media/152162/download  This test is no                          t yet approved or cleared by the [place] FDA and  has been authorized for detection and/or diagnosis of SARS-CoV-2 by FDA under an Emergency Use Authorization (EUA). This EUA will remain  in effect (meaning this test can be used) for the duration of the COVID-19 declaration under Section 564(b)(1) of the Act, 21 U.S.C.section 360bbb-3(b)(1), unless the authorization is terminated  or revoked sooner.       Influenza A by PCR 04/27/2022 NEGATIVE  NEGATIVE Final   Influenza B by PCR 04/27/2022 NEGATIVE  NEGATIVE Final    Comment: (NOTE) The Xpert Xpress SARS-CoV-2/FLU/RSV plus assay is intended as an aid in the diagnosis of influenza from Nasopharyngeal swab specimens and should not be used as a sole basis for treatment. Nasal washings and aspirates are unacceptable for Xpert Xpress SARS-CoV-2/FLU/RSV testing.  Fact Sheet for Patients: BloggerCourse.comhttps://www.fda.gov/media/152166/download  Fact Sheet for Healthcare Providers: SeriousBroker.ithttps://www.fda.gov/media/152162/download  This test is not yet approved or cleared by the [place] FDA and has been authorized for detection and/or diagnosis of SARS-CoV-2 by FDA under an Emergency Use Authorization (EUA). This EUA will remain in effect (meaning this test can be used) for the duration of the COVID-19 declaration under Section 564(b)(1) of the Act, 21 U.S.C. section 360bbb-3(b)(1), unless the authorization is terminated or revoked.  Performed at [facility], [address] [place], [place] [id number]    WBC 04/27/2022 10.0  4.0 - 10.5 K/uL Final   RBC 04/27/2022 3.95  3.87 - 5.11 MIL/uL Final   Hemoglobin 04/27/2022 11.9 (L)  12.0 - 15.0 g/dL Final   HCT 60/45/409805/20/2023 35.2 (L)  36.0 - 46.0 % Final   MCV 04/27/2022 89.1  80.0 - 100.0 fL Final   MCH 04/27/2022 30.1  26.0 - 34.0 pg Final   MCHC 04/27/2022 33.8  30.0 - 36.0 g/dL Final   RDW 11/91/478205/20/2023 13.0  11.5 - 15.5 % Final   Platelets 04/27/2022 394  150 - 400 K/uL Final   nRBC 04/27/2022 0.0  0.0 - 0.2 % Final   Neutrophils Relative % 04/27/2022 58  % Final   Neutro Abs 04/27/2022 5.8  1.7 - 7.7 K/uL Final   Lymphocytes Relative 04/27/2022 31  % Final   Lymphs Abs 04/27/2022 3.1  0.7 - 4.0 K/uL Final   Monocytes Relative 04/27/2022 8  % Final   Monocytes Absolute 04/27/2022 0.8  0.1 - 1.0 K/uL Final   Eosinophils Relative 04/27/2022 2  % Final   Eosinophils Absolute 04/27/2022 0.2  0.0 - 0.5 K/uL Final   Basophils Relative 04/27/2022 1  % Final   Basophils Absolute 04/27/2022 0.1  0.0 - 0.1 K/uL Final    Immature Granulocytes 04/27/2022 0  % Final   Abs Immature Granulocytes 04/27/2022 0.02  0.00 - 0.07 K/uL Final   Performed at [facility], [address], [place], [place] [address]   Sodium 04/27/2022 139  135 - 145 mmol/L Final   Potassium 04/27/2022 3.9  3.5 - 5.1 mmol/L Final   Chloride 04/27/2022 107  98 - 111 mmol/L Final   CO2 04/27/2022 25  22 - 32 mmol/L Final   Glucose, Bld 04/27/2022 88  70 - 99 mg/dL Final   Glucose reference range applies only to samples taken after fasting for at least 8 hours.   BUN 04/27/2022 23  8 - 23 mg/dL Final   Creatinine, Ser 04/27/2022 0.63  0.44 - 1.00 mg/dL Final   Calcium 60/45/4098 9.2  8.9 - 10.3 mg/dL Final   Total Protein 11/91/4782 7.0  6.5 - 8.1 g/dL Final   Albumin 95/62/1308 4.1  3.5 - 5.0 g/dL Final   AST 65/78/4696 22  15 - 41 U/L Final   ALT 04/27/2022 14  0 - 44 U/L Final   Alkaline Phosphatase 04/27/2022 91  38 - 126 U/L Final   Total Bilirubin 04/27/2022 <0.1 (L)  0.3 - 1.2 mg/dL Final   GFR, Estimated 04/27/2022 >60  >60 mL/min Final   Comment: (NOTE) Calculated using the CKD-EPI Creatinine Equation (2021)    Anion gap 04/27/2022 7  5 - 15 Final   Performed at [facility], [address] [place], [place] [address]   Hgb A1c MFr Bld 04/27/2022 5.7 (H)  4.8 - 5.6 % Final   Comment: (NOTE) Pre diabetes:          5.7%-6.4%  Diabetes:              >6.4%  Glycemic control for   <7.0% adults with diabetes    Mean Plasma Glucose 04/27/2022 116.89  mg/dL Final   Performed at [facility], [address] [place], [place] [address]   Alcohol, Ethyl (B) 04/27/2022 <10  <10 mg/dL Final   Comment: (NOTE) Lowest detectable limit for serum alcohol is 10 mg/dL.  For medical purposes only. Performed at [facility], [address], [place], [place] [address]    Cholesterol 04/27/2022 133  0 - 200 mg/dL Final   Triglycerides 72/53/6644 86  <150 mg/dL Final   HDL 03/47/4259 91  >40 mg/dL Final   Total  CHOL/HDL Ratio 04/27/2022 1.5  RATIO Final   VLDL 04/27/2022 17  0 - 40 mg/dL Final   LDL Cholesterol 04/27/2022 25  0 - 99 mg/dL Final   Comment:        Total Cholesterol/HDL:CHD Risk Coronary Heart Disease Risk Table                     Men   Women  1/2 Average Risk   3.4   3.3  Average Risk       5.0   4.4  2 X Average Risk   9.6   7.1  3 X Average Risk  23.4   11.0        Use the calculated Patient Ratio above and the CHD Risk Table to determine the patient's CHD Risk.        ATP III CLASSIFICATION (LDL):  <100     mg/dL   Optimal  563-875  mg/dL   Near or Above                    Optimal  130-159  mg/dL  Borderline  160-189  mg/dL   High  >749     mg/dL   Very High Performed at [facility], [address], [place], [place] [address]    TSH 04/27/2022 0.724  0.350 - 4.500 uIU/mL Final   Comment: Performed by a 3rd Generation assay with a functional sensitivity of <=0.01 uIU/mL. Performed at [facility], [address] [place], [place] [address]    POC Amphetamine UR 04/27/2022 Positive (A)  NONE DETECTED (Cut Off Level 1000 ng/mL) Final   POC Secobarbital (BAR) 04/27/2022 None Detected  NONE DETECTED (Cut Off Level 300 ng/mL) Final   POC Buprenorphine (BUP) 04/27/2022 None Detected  NONE DETECTED (Cut Off Level 10 ng/mL) Final   POC Oxazepam (BZO) 04/27/2022 None Detected  NONE DETECTED (Cut Off Level 300 ng/mL) Final   POC Cocaine UR 04/27/2022 None Detected  NONE DETECTED (Cut Off Level 300 ng/mL) Final   POC Methamphetamine UR 04/27/2022 Positive (A)  NONE DETECTED (Cut Off Level 1000 ng/mL) Final   POC Morphine 04/27/2022 None Detected  NONE DETECTED (Cut Off Level 300 ng/mL) Final   POC Methadone UR 04/27/2022 None Detected  NONE DETECTED (Cut Off Level 300 ng/mL) Final   POC Oxycodone UR 04/27/2022 None Detected  NONE DETECTED (Cut Off Level 100 ng/mL) Final   POC Marijuana UR 04/27/2022 Positive (A)  NONE DETECTED (Cut Off Level 50 ng/mL) Final    SARSCOV2ONAVIRUS 2 AG 04/27/2022 NEGATIVE  NEGATIVE Final   Comment: (NOTE) SARS-CoV-2 antigen NOT DETECTED.   Negative results are presumptive.  Negative results do not preclude SARS-CoV-2 infection and should not be used as the sole basis for treatment or other patient management decisions, including infection  control decisions, particularly in the presence of clinical signs and  symptoms consistent with COVID-19, or in those who have been in contact with the virus.  Negative results must be combined with clinical observations, patient history, and epidemiological information. The expected result is Negative.  Fact Sheet for Patients: https://www.jennings-kim.com/  Fact Sheet for Healthcare Providers: https://alexander-rogers.biz/  This test is not yet approved or cleared by the [place] FDA and  has been authorized for detection and/or diagnosis of SARS-CoV-2 by FDA under an Emergency Use Authorization (EUA).  This EUA will remain in effect (meaning this test can be used) for the duration of  the COV                          ID-19 declaration under Section 564(b)(1) of the Act, 21 U.S.C. section 360bbb-3(b)(1), unless the authorization is terminated or revoked sooner.     Color, Urine 04/28/2022 STRAW (A)  YELLOW Final   APPearance 04/28/2022 CLEAR  CLEAR Final   Specific Gravity, Urine 04/28/2022 1.013  1.005 - 1.030 Final   pH 04/28/2022 6.0  5.0 - 8.0 Final   Glucose, UA 04/28/2022 NEGATIVE  NEGATIVE mg/dL Final   Hgb urine dipstick 04/28/2022 NEGATIVE  NEGATIVE Final   Bilirubin Urine 04/28/2022 NEGATIVE  NEGATIVE Final   Ketones, ur 04/28/2022 NEGATIVE  NEGATIVE mg/dL Final   Protein, ur 84/66/5993 NEGATIVE  NEGATIVE mg/dL Final   Nitrite 57/12/7791 NEGATIVE  NEGATIVE Final   Leukocytes,Ua 04/28/2022 NEGATIVE  NEGATIVE Final   Performed at [facility], [address], [place], [place] [address]    Blood Alcohol level:  Lab  Results  Component Value Date   [facility] <10 04/27/2022    Metabolic Disorder Labs: Lab Results  Component Value Date   HGBA1C 5.7 (H) 04/27/2022   MPG 116.89 04/27/2022   No results found for: PROLACTIN Lab Results  Component Value Date   CHOL 133 04/27/2022   TRIG 86 04/27/2022   HDL 91 04/27/2022   CHOLHDL 1.5 04/27/2022   VLDL 17 04/27/2022   LDLCALC 25 04/27/2022    Therapeutic Lab Levels: No results found for: LITHIUM No results found for: VALPROATE No components found for:  CBMZ  Physical Findings   PHQ2-9    Flowsheet Row ED from 04/27/2022 in [facility]  PHQ-2 Total Score 6  PHQ-9 Total Score 24      Flowsheet Row ED from 04/27/2022 in [facility]  C-SSRS RISK CATEGORY Error: Question 2 not populated        Musculoskeletal  Strength & Muscle Tone: within normal limits Gait & Station: normal Patient leans: N/A  Psychiatric Specialty Exam  Presentation  General Appearance: Appropriate for Environment; Casual  Eye Contact:Good  Speech:Clear and Coherent; Normal Rate  Speech Volume:Normal  Handedness:Right   Mood and Affect  Mood:Anxious; Depressed  Affect:Congruent   Thought Process  Thought Processes:Coherent  Descriptions of Associations:Intact  Orientation:Full (Time, Place and Person)  Thought Content:Logical     Hallucinations:Hallucinations: Auditory Description of Auditory Hallucinations: hears sons voice and conversations replaying they have had  Ideas of Reference:None  Suicidal Thoughts:Suicidal Thoughts: No  Homicidal Thoughts:Homicidal Thoughts: No   Sensorium  Memory:Immediate Good; Remote Good; Recent Good  Judgment:Fair  Insight:Fair   Executive Functions  Concentration:Good  Attention Span:Good  Recall:Good  Fund of Knowledge:Good  Language:Good   Psychomotor Activity  Psychomotor Activity:Psychomotor Activity: Normal  Assets   Assets:Communication Skills; Desire for Improvement; Financial Resources/Insurance; Leisure Time; Physical Health; Resilience   Sleep  Sleep:Sleep: Poor Number of Hours of Sleep: 2  No data recorded  Physical Exam  Physical Exam Vitals and nursing note reviewed.  Constitutional:      General: She is not in acute distress.    Appearance: Normal appearance.     Comments: thin  HENT:     Head: Normocephalic.  Cardiovascular:     Rate and Rhythm: Tachycardia present.  Pulmonary:     Effort: Pulmonary effort is normal. No respiratory distress.  Musculoskeletal:        General: Normal range of motion.  Neurological:     General: No focal deficit present.     Mental Status: She is oriented to person, place, and time.  Psychiatric:        Attention and Perception: Attention normal. She perceives auditory hallucinations.        Mood and Affect: Mood is anxious and depressed. Affect is tearful.        Speech: Speech normal.        Behavior: Behavior is cooperative.        Cognition and Memory: Cognition normal.   Review of Systems  Constitutional: Negative.   HENT: Negative.    Eyes: Negative.   Respiratory: Negative.    Cardiovascular: Negative.   Musculoskeletal: Negative.   Skin: Negative.   Neurological: Negative.   Psychiatric/Behavioral:  Positive for depression, hallucinations and substance abuse. Negative for suicidal ideas. The patient is nervous/anxious and has insomnia.   Blood pressure 127/80, pulse (!) 101, temperature 98.7 F (37.1 C), temperature source Tympanic, resp. rate 18, SpO2 100 %. There is no height or weight on file to calculate BMI.  Treatment Plan Summary: Disposition: Patient continues to meet criteria  for treatment in the [facility].  We will continue to have daily contact with patient to assess and evaluate symptoms and progress in treatment and Medication management.   -Continue  Cymbalta 60 mg/day for depression -Continue ibuprofen 600 mg every 6  hours as needed for pain -Continue trazodone 100 mg nightly as needed -Continue Norvasc 10 mg daily for elevated blood pressure.   -Start Risperdal 1 mg BID for auditory hallucinations.   Educated patient on medications and she is in agreement.  Mariel Craft, MD 04/30/2022 11:33 AM

## 2022-04-30 NOTE — Progress Notes (Addendum)
Shannon Burgess got up to eat dinner with her peers, she verbalized feeling better, less anxious, after taking the Vistaril earlier and napping.

## 2022-04-30 NOTE — Clinical Social Work Psych Note (Signed)
LCSW Update   Shannon Burgess shared that she was "possibly interested" in participating in residential treatment. Shannon Burgess reports that she "needs somewhere to stay until June 1st".   Shannon Burgess denied having any SI, HI or AVH at this time, however she does continue to report hearing her son's voice in an "argument style". Shannon Burgess shared that she and her son had a "fallen out" prior to her arrival to the [facility].   Shannon Burgess reports that she and her roommate are planning to sign a lease on a new home on June 1st.   LCSW informed Shannon Burgess that due to her Medicare insurance coverage, that limits current options for residential substance abuse treatment in the area.  LCSW shared that [facility] in [place], [place] does accept various forms of Medicare, however it is to be determined if she would be accepted after a referral has been made.   Shannon Burgess declined the referral due to the facility not being in the [place] area.   [facility] - not appropriate due to payor source; no beds available [facility] - no beds available  [facility] - no beds available  [facility] - no beds available  [facility] - not appropriate due to payor source  Shannon Burgess reports she and her roommate plan to continue to seeking temporary housing. LCSW will continue to follow for alternative placement options.      Baldo Daub, MSW, LCSW Clinical Child psychotherapist (Facility Based Crisis) [facility]

## 2022-05-01 DIAGNOSIS — F321 Major depressive disorder, single episode, moderate: Secondary | ICD-10-CM | POA: Diagnosis not present

## 2022-05-01 MED ORDER — RISPERIDONE 2 MG PO TABS
2.0000 mg | ORAL_TABLET | Freq: Two times a day (BID) | ORAL | Status: DC
  Administered 2022-05-01 – 2022-05-02 (×2): 2 mg via ORAL
  Filled 2022-05-01 (×2): qty 1

## 2022-05-01 MED ORDER — RISPERIDONE 1 MG PO TABS
1.0000 mg | ORAL_TABLET | Freq: Once | ORAL | Status: AC
  Administered 2022-05-01: 1 mg via ORAL
  Filled 2022-05-01: qty 1

## 2022-05-01 NOTE — ED Notes (Signed)
Pt is currently sleeping, no distress noted, environmental check complete, will continue to monitor patient for safety. ? ?

## 2022-05-01 NOTE — ED Notes (Signed)
Patient resting quietly in bed with eyes closed, Respirations equal and unlabored, skin warm and dry, NAD. No change in assessment or acuity. Q 15 minute safety checks remain in place.   

## 2022-05-01 NOTE — ED Notes (Signed)
Patient denies SI, HI, VH. Patient reports AH telling her that he son hates her. Patient is able to contract for safety while on unit. Patient was informed to use deep breathing techniques if she felt overwhelmed or anxious. Respirations equal and unlabored, skin warm and dry. No acute distress noted. Will continue to monitor patient.

## 2022-05-01 NOTE — ED Notes (Addendum)
Patient denies SI, HI , and AVH. Patient compliant with medication administration. See MAR . Patient stated she was anxious and had back pain.  Patient was calm and pleasant. Patient sleeping peacefully. No acute distress noted. Will continue to monitor for safety.

## 2022-05-01 NOTE — ED Notes (Signed)
Notified pt that breakfast is ready 

## 2022-05-01 NOTE — Group Note (Signed)
Group Topic: Understanding Self  Group Date: 05/01/2022 Start Time: 1220 End Time: 1245 Facilitators: Levander Campion  Department: [facility]  Number of Participants: 5  Group Focus: activities of daily living skills, coping skills, and problem solving Treatment Modality:  Behavior Modification Therapy and Solution-Focused Therapy Interventions utilized were patient education Purpose: enhance coping skills and regain self-worth  Name: Rilya Longo Date of Birth: [birth year]  MR: [id number]    Level of Participation: moderate Quality of Participation: attentive Interactions with others: gave feedback Mood/Affect: appropriate Triggers (if applicable): n/a Cognition: coherent/clear Progress: Moderate Response: n/a Plan: follow-up needed  Patients Problems:  Patient Active Problem List   Diagnosis Date Noted   MDD (major depressive disorder) 04/27/2022

## 2022-05-01 NOTE — Clinical Social Work Psych Note (Signed)
LCSW Update Note     Caelen shared that she felt better today. She reports that she slept well and even believe there was a decrease in her reported auditory hallucinations. Audra denied having any SI, HI or VH at this time.   Tama continues to decline any residential treatment services. Johnathon shared that her reasoning for declining residential treatment due to her signing a new lease on July 1st.   LCSW and Dr. Leverne Humbles explained to [facility] that she more than likely will not remain in the [facility] until June 1st for housing. Allisha expressed understanding.   LCSW provided Ioanna with housing resources, including [employer], [employer] and  an [employer]. Reyanna ensured providers that she will follow up with the contacts to finding temporary housing until June 1st.    LCSW will continue to follow.    Radonna Ricker, MSW, LCSW Clinical Education officer, museum ([place]) [facility]

## 2022-05-01 NOTE — ED Provider Notes (Signed)
Behavioral Health Progress Note  Date and Time: 05/01/2022 5:07 PM Name: Shannon Burgess MRN:  [id number]  Subjective:    Shannon Burgess 71 y.o., female patient presented to [facility] with increased depression and methamphetamine use.  She was admitted to the facility based crisis unit.    Shannon Burgess, 71 y.o., female patient seen face to face by this provider.  Chart is reviewed on 05/01/22. Upon initial assessment UDS positive for amphetamines, methamphetamines, and marijuana.  BAL negative.  Patient is seen with LCSW for treatment team. On today's reevaluation patient is observed in her room.  She remains in bed. She is alert/oriented and cooperative.  Her behavior is normal.  She endorses sleeping better and eating okay.  She describes body aches from methamphetamine withdrawal.  Overall withdrawal symptoms are decreasing.  Patient denies suicidal ideation, plan, or intent.  She denies homicidal ideation.  She denies visual hallucinations she reports that her auditory hallucinations have decreased but are still persistent of her son's voice.  Patient is agreeable to increasing dose of Risperdal, noting that it has also seemed to decrease her anxiety as well as improve her sleep.  On reevaluation later in the unit, patient states that her auditory hallucinations have not been noticed since increased dose of Risperdal.   Medication changes on 04/29/2022: Cymbalta from 30 mg to 60 mg/day, added ibuprofen 600 mg every 6 hours as needed for pain, increase trazodone to 100 mg nightly as needed and increased Norvasc to 10 mg daily for elevated blood pressure.   Medication changes on 04/30/2022: Risperdal 1 mg twice daily for auditory hallucinations.      Diagnosis:  Final diagnoses:  Current moderate episode of major depressive disorder without prior episode (HCC)  Methamphetamine use (HCC)   Total Time Spent in Direct Patient Care:  I personally spent 35 minutes on the unit in direct patient care.  The direct patient care time included face-to-face time with the patient, reviewing the patient's chart, communicating with other professionals, and coordinating care. Greater than 50% of this time was spent in counseling or coordinating care with the patient regarding goals of hospitalization, psycho-education, and discharge planning needs.  Past Psychiatric History: See H&P Past Medical History: No past medical history on file. The histories are not reviewed yet. Please review them in the "History" navigator section and refresh this SmartLink. Family History: No family history on file. Family Psychiatric  History: See H&P Social History:  Social History   Substance and Sexual Activity  Alcohol Use Not on file     Social History   Substance and Sexual Activity  Drug Use Not on file    Social History   Socioeconomic History   Marital status: Divorced    Spouse name: Not on file   Number of children: Not on file   Years of education: Not on file   Highest education level: Not on file  Occupational History   Not on file  Tobacco Use   Smoking status: Not on file   Smokeless tobacco: Not on file  Substance and Sexual Activity   Alcohol use: Not on file   Drug use: Not on file   Sexual activity: Not on file  Other Topics Concern   Not on file  Social History Narrative   Not on file   Social Determinants of Health   Financial Resource Strain: Not on file  Food Insecurity: Not on file  Transportation Needs: Not on file  Physical Activity: Not on file  Stress: Not on file  Social Connections: Not on file   SDOH:  SDOH Screenings   Alcohol Screen: Not on file  Depression (PHQ2-9): Medium Risk   PHQ-2 Score: 24  Financial Resource Strain: Not on file  Food Insecurity: Not on file  Housing: Not on file  Physical Activity: Not on file  Social Connections: Not on file  Stress: Not on file  Tobacco Use: Not on file  Transportation Needs: Not on file   Additional  Social History:    Pain Medications: See MAR Prescriptions: See MAR Over the Counter: See MAR History of alcohol / drug use?: Yes Name of Substance 1: Amphetamines. 1 - Age of First Use: UTA 1 - Amount (size/oz): Pt reports, "I dont know." 1 - Frequency: Ongoing. 1 - Duration: Ongoing. 1 - Last Use / Amount: Yesterday. 1 - Method of Aquiring: Purchase. 1- Route of Use: UTA Name of Substance 2: Marijuana. 2 - Age of First Use: UTA 2 - Amount (size/oz): UTA 2 - Frequency: Ongoing. 2 - Duration: Daily. 2 - Last Use / Amount: Daily. 2 - Method of Aquiring: Purchase. 2 - Route of Substance Use: Oral                Sleep: Poor  Appetite:  Fair  Current Medications:  Current Facility-Administered Medications  Medication Dose Route Frequency Provider Last Rate Last Admin   acetaminophen (TYLENOL) tablet 650 mg  650 mg Oral Q6H PRN Nwoko, Uchenna E, PA   650 mg at 04/28/22 1801   alum & mag hydroxide-simeth (MAALOX/MYLANTA) 200-200-20 MG/5ML suspension 30 mL  30 mL Oral Q4H PRN Nwoko, Uchenna E, PA       amLODipine (NORVASC) tablet 10 mg  10 mg Oral Daily Thomes Lolling H, NP   10 mg at 05/01/22 0855   DULoxetine (CYMBALTA) DR capsule 60 mg  60 mg Oral Daily Revonda Humphrey, NP   60 mg at 05/01/22 0856   hydrOXYzine (ATARAX) tablet 25 mg  25 mg Oral TID PRN Nwoko, Uchenna E, PA   25 mg at 04/30/22 2107   ibuprofen (ADVIL) tablet 600 mg  600 mg Oral Q6H PRN Revonda Humphrey, NP   600 mg at 04/30/22 2107   magnesium hydroxide (MILK OF MAGNESIA) suspension 30 mL  30 mL Oral Daily PRN Nwoko, Uchenna E, PA       nicotine (NICODERM CQ - dosed in mg/24 hours) patch 21 mg  21 mg Transdermal Daily Thomes Lolling H, NP   21 mg at 05/01/22 0900   risperiDONE (RISPERDAL) tablet 2 mg  2 mg Oral BID Lavella Hammock, MD       traZODone (DESYREL) tablet 100 mg  100 mg Oral QHS PRN Revonda Humphrey, NP   100 mg at 04/30/22 2104   No current outpatient medications on file.    Labs   Lab Results:  Admission on 04/27/2022  Component Date Value Ref Range Status   SARS Coronavirus 2 by RT PCR 04/27/2022 NEGATIVE  NEGATIVE Final   Comment: (NOTE) SARS-CoV-2 target nucleic acids are NOT DETECTED.  The SARS-CoV-2 RNA is generally detectable in upper respiratory specimens during the acute phase of infection. The lowest concentration of SARS-CoV-2 viral copies this assay can detect is 138 copies/mL. A negative result does not preclude SARS-Cov-2 infection and should not be used as the sole basis for treatment or other patient management decisions. A negative result may occur with  improper specimen collection/handling, submission of specimen other  than nasopharyngeal swab, presence of viral mutation(s) within the areas targeted by this assay, and inadequate number of viral copies(<138 copies/mL). A negative result must be combined with clinical observations, patient history, and epidemiological information. The expected result is Negative.  Fact Sheet for Patients:  EntrepreneurPulse.com.au  Fact Sheet for Healthcare Providers:  IncredibleEmployment.be  This test is no                          t yet approved or cleared by the [place] FDA and  has been authorized for detection and/or diagnosis of SARS-CoV-2 by FDA under an Emergency Use Authorization (EUA). This EUA will remain  in effect (meaning this test can be used) for the duration of the COVID-19 declaration under Section 564(b)(1) of the Act, 21 U.S.C.section 360bbb-3(b)(1), unless the authorization is terminated  or revoked sooner.       Influenza A by PCR 04/27/2022 NEGATIVE  NEGATIVE Final   Influenza B by PCR 04/27/2022 NEGATIVE  NEGATIVE Final   Comment: (NOTE) The Xpert Xpress SARS-CoV-2/FLU/RSV plus assay is intended as an aid in the diagnosis of influenza from Nasopharyngeal swab specimens and should not be used as a sole basis for treatment. Nasal  washings and aspirates are unacceptable for Xpert Xpress SARS-CoV-2/FLU/RSV testing.  Fact Sheet for Patients: EntrepreneurPulse.com.au  Fact Sheet for Healthcare Providers: IncredibleEmployment.be  This test is not yet approved or cleared by the [place] FDA and has been authorized for detection and/or diagnosis of SARS-CoV-2 by FDA under an Emergency Use Authorization (EUA). This EUA will remain in effect (meaning this test can be used) for the duration of the COVID-19 declaration under Section 564(b)(1) of the Act, 21 U.S.C. section 360bbb-3(b)(1), unless the authorization is terminated or revoked.  Performed at [facility], [place] [address] [place], [place] [address]    WBC 04/27/2022 10.0  4.0 - 10.5 K/uL Final   RBC 04/27/2022 3.95  3.87 - 5.11 MIL/uL Final   Hemoglobin 04/27/2022 11.9 (L)  12.0 - 15.0 g/dL Final   HCT 04/27/2022 35.2 (L)  36.0 - 46.0 % Final   MCV 04/27/2022 89.1  80.0 - 100.0 fL Final   MCH 04/27/2022 30.1  26.0 - 34.0 pg Final   MCHC 04/27/2022 33.8  30.0 - 36.0 g/dL Final   RDW 04/27/2022 13.0  11.5 - 15.5 % Final   Platelets 04/27/2022 394  150 - 400 K/uL Final   nRBC 04/27/2022 0.0  0.0 - 0.2 % Final   Neutrophils Relative % 04/27/2022 58  % Final   Neutro Abs 04/27/2022 5.8  1.7 - 7.7 K/uL Final   Lymphocytes Relative 04/27/2022 31  % Final   Lymphs Abs 04/27/2022 3.1  0.7 - 4.0 K/uL Final   Monocytes Relative 04/27/2022 8  % Final   Monocytes Absolute 04/27/2022 0.8  0.1 - 1.0 K/uL Final   Eosinophils Relative 04/27/2022 2  % Final   Eosinophils Absolute 04/27/2022 0.2  0.0 - 0.5 K/uL Final   Basophils Relative 04/27/2022 1  % Final   Basophils Absolute 04/27/2022 0.1  0.0 - 0.1 K/uL Final   Immature Granulocytes 04/27/2022 0  % Final   Abs Immature Granulocytes 04/27/2022 0.02  0.00 - 0.07 K/uL Final   Performed at [facility], [place] [address], [place], [place] [address]   Sodium 04/27/2022  139  135 - 145 mmol/L Final   Potassium 04/27/2022 3.9  3.5 - 5.1 mmol/L Final  Chloride 04/27/2022 107  98 - 111 mmol/L Final   CO2 04/27/2022 25  22 - 32 mmol/L Final   Glucose, Bld 04/27/2022 88  70 - 99 mg/dL Final   Glucose reference range applies only to samples taken after fasting for at least 8 hours.   BUN 04/27/2022 23  8 - 23 mg/dL Final   Creatinine, Ser 04/27/2022 0.63  0.44 - 1.00 mg/dL Final   Calcium 04/27/2022 9.2  8.9 - 10.3 mg/dL Final   Total Protein 04/27/2022 7.0  6.5 - 8.1 g/dL Final   Albumin 04/27/2022 4.1  3.5 - 5.0 g/dL Final   AST 04/27/2022 22  15 - 41 U/L Final   ALT 04/27/2022 14  0 - 44 U/L Final   Alkaline Phosphatase 04/27/2022 91  38 - 126 U/L Final   Total Bilirubin 04/27/2022 <0.1 (L)  0.3 - 1.2 mg/dL Final   GFR, Estimated 04/27/2022 >60  >60 mL/min Final   Comment: (NOTE) Calculated using the CKD-EPI Creatinine Equation (2021)    Anion gap 04/27/2022 7  5 - 15 Final   Performed at [place] [address] [place], [place] [address]   Hgb A1c MFr Bld 04/27/2022 5.7 (H)  4.8 - 5.6 % Final   Comment: (NOTE) Pre diabetes:          5.7%-6.4%  Diabetes:              >6.4%  Glycemic control for   <7.0% adults with diabetes    Mean Plasma Glucose 04/27/2022 116.89  mg/dL Final   Performed at [facility], [place] [address], [place], [place] [address]   Alcohol, Ethyl (B) 04/27/2022 <10  <10 mg/dL Final   Comment: (NOTE) Lowest detectable limit for serum alcohol is 10 mg/dL.  For medical purposes only. Performed at [facility], [place] [address] [place], [place] [address]    Cholesterol 04/27/2022 133  0 - 200 mg/dL Final   Triglycerides 04/27/2022 86  <150 mg/dL Final   HDL 04/27/2022 91  >40 mg/dL Final   Total CHOL/HDL Ratio 04/27/2022 1.5  RATIO Final   VLDL 04/27/2022 17  0 - 40 mg/dL Final   LDL Cholesterol 04/27/2022 25  0 - 99 mg/dL Final   Comment:        Total Cholesterol/HDL:CHD Risk Coronary Heart Disease  Risk Table                     Men   Women  1/2 Average Risk   3.4   3.3  Average Risk       5.0   4.4  2 X Average Risk   9.6   7.1  3 X Average Risk  23.4   11.0        Use the calculated Patient Ratio above and the CHD Risk Table to determine the patient's CHD Risk.        ATP III CLASSIFICATION (LDL):  <100     mg/dL   Optimal  100-129  mg/dL   Near or Above                    Optimal  130-159  mg/dL   Borderline  160-189  mg/dL   High  >190     mg/dL   Very High Performed at [place] [address] [place], [place] [address]    TSH 04/27/2022 0.724  0.350 - 4.500 uIU/mL Final  Comment: Performed by a 3rd Generation assay with a functional sensitivity of <=0.01 uIU/mL. Performed at [facility], [place] [address] [place], [place] [address]    POC Amphetamine UR 04/27/2022 Positive (A)  NONE DETECTED (Cut Off Level 1000 ng/mL) Final   POC Secobarbital (BAR) 04/27/2022 None Detected  NONE DETECTED (Cut Off Level 300 ng/mL) Final   POC Buprenorphine (BUP) 04/27/2022 None Detected  NONE DETECTED (Cut Off Level 10 ng/mL) Final   POC Oxazepam (BZO) 04/27/2022 None Detected  NONE DETECTED (Cut Off Level 300 ng/mL) Final   POC Cocaine UR 04/27/2022 None Detected  NONE DETECTED (Cut Off Level 300 ng/mL) Final   POC Methamphetamine UR 04/27/2022 Positive (A)  NONE DETECTED (Cut Off Level 1000 ng/mL) Final   POC Morphine 04/27/2022 None Detected  NONE DETECTED (Cut Off Level 300 ng/mL) Final   POC Methadone UR 04/27/2022 None Detected  NONE DETECTED (Cut Off Level 300 ng/mL) Final   POC Oxycodone UR 04/27/2022 None Detected  NONE DETECTED (Cut Off Level 100 ng/mL) Final   POC Marijuana UR 04/27/2022 Positive (A)  NONE DETECTED (Cut Off Level 50 ng/mL) Final   SARSCOV2ONAVIRUS 2 AG 04/27/2022 NEGATIVE  NEGATIVE Final   Comment: (NOTE) SARS-CoV-2 antigen NOT DETECTED.   Negative results are presumptive.  Negative results do not preclude SARS-CoV-2 infection and should  not be used as the sole basis for treatment or other patient management decisions, including infection  control decisions, particularly in the presence of clinical signs and  symptoms consistent with COVID-19, or in those who have been in contact with the virus.  Negative results must be combined with clinical observations, patient history, and epidemiological information. The expected result is Negative.  Fact Sheet for Patients: HandmadeRecipes.com.cy  Fact Sheet for Healthcare Providers: FuneralLife.at  This test is not yet approved or cleared by the [place] FDA and  has been authorized for detection and/or diagnosis of SARS-CoV-2 by FDA under an Emergency Use Authorization (EUA).  This EUA will remain in effect (meaning this test can be used) for the duration of  the COV                          ID-19 declaration under Section 564(b)(1) of the Act, 21 U.S.C. section 360bbb-3(b)(1), unless the authorization is terminated or revoked sooner.     Color, Urine 04/28/2022 STRAW (A)  YELLOW Final   APPearance 04/28/2022 CLEAR  CLEAR Final   Specific Gravity, Urine 04/28/2022 1.013  1.005 - 1.030 Final   pH 04/28/2022 6.0  5.0 - 8.0 Final   Glucose, UA 04/28/2022 NEGATIVE  NEGATIVE mg/dL Final   Hgb urine dipstick 04/28/2022 NEGATIVE  NEGATIVE Final   Bilirubin Urine 04/28/2022 NEGATIVE  NEGATIVE Final   Ketones, ur 04/28/2022 NEGATIVE  NEGATIVE mg/dL Final   Protein, ur 04/28/2022 NEGATIVE  NEGATIVE mg/dL Final   Nitrite 04/28/2022 NEGATIVE  NEGATIVE Final   Leukocytes,Ua 04/28/2022 NEGATIVE  NEGATIVE Final   Performed at [facility], [place] [address] [place], [place] [address]    Blood Alcohol level:  Lab Results  Component Value Date   ETH <10 123456    Metabolic Disorder Labs: Lab Results  Component Value Date   HGBA1C 5.7 (H) 04/27/2022   MPG 116.89 04/27/2022   No results found for: PROLACTIN Lab Results   Component Value Date   CHOL 133 04/27/2022   TRIG 86 04/27/2022   HDL 91 04/27/2022   CHOLHDL  1.5 04/27/2022   VLDL 17 04/27/2022   LDLCALC 25 04/27/2022    Therapeutic Lab Levels: No results found for: LITHIUM No results found for: VALPROATE No components found for:  CBMZ  Physical Findings   PHQ2-9    Flowsheet Row ED from 04/27/2022 in [facility]  PHQ-2 Total Score 6  PHQ-9 Total Score 24      Flowsheet Row ED from 04/27/2022 in [facility]        Musculoskeletal  Strength & Muscle Tone: within normal limits Gait & Station: normal Patient leans: N/A  Psychiatric Specialty Exam  Presentation  General Appearance: Disheveled  Eye Contact:Fleeting  Speech:Clear and Coherent  Speech Volume:Normal  Handedness:Right   Mood and Affect  Mood:Depressed; Anxious; Worthless; Hopeless  Affect:Congruent   Thought Process  Thought Processes:Coherent  Descriptions of Associations:Tangential  Orientation:Full (Time, Place and Person)  Thought Content:Logical     Hallucinations:Hallucinations: Auditory Description of Auditory Hallucinations: son's voice.  less since starting Risperdal  Ideas of Reference:None  Suicidal Thoughts:Suicidal Thoughts: No  Homicidal Thoughts:Homicidal Thoughts: No   Sensorium  Memory:Immediate Fair; Recent Fair; Remote Fair  Judgment:Fair  Insight:Fair   Executive Functions  Concentration:Fair  Attention Span:Fair  [place]  Language:Good   Psychomotor Activity  Psychomotor Activity:Psychomotor Activity: Restlessness  Assets  Assets:Communication Skills; Desire for Improvement; Resilience   Sleep  Sleep:Sleep: Fair ("better")  No data recorded  Physical Exam  Physical Exam Vitals and nursing note reviewed.  Constitutional:      Appearance: Normal appearance.     Comments: thin   HENT:     Head: Normocephalic.  Cardiovascular:     Rate and Rhythm: Normal rate.  Pulmonary:     Effort: Pulmonary effort is normal. No respiratory distress.  Musculoskeletal:        General: Normal range of motion.  Neurological:     General: No focal deficit present.     Mental Status: She is oriented to person, place, and time.  Psychiatric:        Attention and Perception: Attention normal. She perceives auditory hallucinations.        Mood and Affect: Mood is anxious and depressed.        Speech: Speech normal.        Behavior: Behavior is cooperative.        Cognition and Memory: Cognition normal.   Review of Systems  Constitutional: Negative.   HENT: Negative.    Eyes: Negative.   Respiratory: Negative.    Cardiovascular: Negative.   Musculoskeletal: Negative.   Skin: Negative.   Neurological: Negative.   Psychiatric/Behavioral:  Positive for depression, hallucinations and substance abuse. Negative for suicidal ideas. The patient is nervous/anxious and has insomnia.   Blood pressure 96/67, pulse 97, temperature 98 F (36.7 C), temperature source Oral, resp. rate 16, SpO2 100 %. There is no height or weight on file to calculate BMI.  Treatment Plan Summary: Disposition: Patient continues to meet criteria for treatment in the [facility].  We will continue to have daily contact with patient to assess and evaluate symptoms and progress in treatment and Medication management.   -Continue  Cymbalta 60 mg/day for depression -Continue ibuprofen 600 mg every 6 hours as needed for pain -Continue trazodone 100 mg nightly as needed -Continue Norvasc 10 mg daily for elevated blood pressure.   -Increase Risperdal 2 mg BID for auditory hallucinations.   Educated patient on  medications and she is in agreement.  Appreciate LCSW support in helping patient find outpatient substance use treatment program as well as housing.  Lavella Hammock, MD 05/01/2022 5:07 PM

## 2022-05-01 NOTE — ED Notes (Signed)
Patient resting quietly with eyes closed. Respirations equal and unlabored, skin warm and dry. No acute distress noted. Will continue to monitor patient.

## 2022-05-01 NOTE — ED Notes (Signed)
PT is in the dayroom attending [place]

## 2022-05-01 NOTE — ED Notes (Signed)
Pt has taken night time medications and is not sitting in the dining room quietly

## 2022-05-02 DIAGNOSIS — F321 Major depressive disorder, single episode, moderate: Secondary | ICD-10-CM | POA: Diagnosis not present

## 2022-05-02 MED ORDER — RISPERIDONE 2 MG PO TABS
2.0000 mg | ORAL_TABLET | Freq: Two times a day (BID) | ORAL | 0 refills | Status: DC
Start: 2022-05-02 — End: 2022-05-14

## 2022-05-02 MED ORDER — TRAZODONE HCL 100 MG PO TABS: 100.0000 mg | ORAL_TABLET | Freq: Every evening | ORAL | 0 refills | Status: DC | PRN

## 2022-05-02 MED ORDER — DULOXETINE HCL 60 MG PO CPEP: 60.0000 mg | ORAL_CAPSULE | Freq: Every day | ORAL | 0 refills | Status: DC

## 2022-05-02 MED ORDER — NICOTINE 21 MG/24HR TD PT24: 21.0000 mg | MEDICATED_PATCH | Freq: Every day | TRANSDERMAL | 0 refills | Status: DC

## 2022-05-02 MED ORDER — HYDROXYZINE HCL 25 MG PO TABS: 25.0000 mg | ORAL_TABLET | Freq: Three times a day (TID) | ORAL | 0 refills | Status: DC | PRN

## 2022-05-02 NOTE — Discharge Instructions (Addendum)

## 2022-05-02 NOTE — ED Provider Notes (Signed)
FBC/OBS ASAP Discharge Summary  Date and Time: 05/02/2022 3:27 PM  Name: Shannon Burgess  MRN:  [id number]   Discharge Diagnoses:  Final diagnoses:  Current moderate episode of major depressive disorder without prior episode (HCC)  Methamphetamine use (HCC)    Subjective: "I am working on having a place to go until I can sign my new lease on June 1."  Stay Summary: Shannon Burgess is a 71 y.o. female with a history of amphetamine use depression and panic attacks who was admitted to the facility based crisis unit on 04/27/2022.  Patient was seeking substance use rehabilitation with secondary gain for housing.  During her time in [facility], patient endorsed depression triggered by situational stressors.  She also endorsed auditory hallucinations.  She reported previously being on Cymbalta, and was agreeable to starting medications for depression and hallucinations.  Patient was started on Cymbalta 30 mg daily for 1 day then increase to 60 mg daily for depression.  Risperdal was started at 1 mg twice daily and increased to 2 mg twice daily for auditory hallucinations.  Patient's mood continued to improve.  Patient denied auditory or visual hallucinations following increased dose of Risperdal.  Patient was also provided with trazodone 100 mg at bedtime as needed for sleep which she states was effective.  She was provided with hydroxyzine 25 mg 3 times daily as needed for anxiety which is also effective.  Her home medications for blood pressure management were continued.  Patient was provided with NicoDerm patch which was effective in decreasing her nicotine cravings.  She was ambivalent as to whether or not she would quit smoking after discharge.  She denied withdrawal symptoms or cravings for methamphetamine at time of discharge.  She was able to contract for safety.  She specifically denied any suicidal ideation, plan, or intent.  She denied any homicidal ideation.  Patient denied any auditory or visual  hallucinations, and did not appear to be responding to internal stimuli.  She was provided with resources for mental health services as well as substance use treatment after discharge.  Patient declined offer for residential treatment for substance use rehabilitation.   Total Time Spent in Direct Patient Care:  I personally spent 45 minutes on the unit in direct patient care. The direct patient care time included face-to-face time with the patient, reviewing the patient's chart, communicating with other professionals, and coordinating care. Greater than 50% of this time was spent in counseling or coordinating care with the patient regarding goals of hospitalization, psycho-education, and discharge planning needs.  Past Psychiatric History: As above Past Medical History: No past medical history on file.  Hypertension Family History: No family history on file. Family Psychiatric History: None Social History:  Social History   Substance and Sexual Activity  Alcohol Use Not on file     Social History   Substance and Sexual Activity  Drug Use Not on file    Social History   Socioeconomic History   Marital status: Divorced    Spouse name: Not on file   Number of children: Not on file   Years of education: Not on file   Highest education level: Not on file  Occupational History   Not on file  Tobacco Use   Smoking status: Not on file   Smokeless tobacco: Not on file  Substance and Sexual Activity   Alcohol use: Not on file   Drug use: Not on file   Sexual activity: Not on file  Other Topics Concern  Not on file  Social History Narrative   Not on file   Social Determinants of Health   Financial Resource Strain: Not on file  Food Insecurity: Not on file  Transportation Needs: Not on file  Physical Activity: Not on file  Stress: Not on file  Social Connections: Not on file   SDOH:  SDOH Screenings   Alcohol Screen: Not on file  Depression (PHQ2-9): Medium Risk   PHQ-2  Score: 24  Financial Resource Strain: Not on file  Food Insecurity: Not on file  Housing: Not on file  Physical Activity: Not on file  Social Connections: Not on file  Stress: Not on file  Tobacco Use: Not on file  Transportation Needs: Not on file    Tobacco Cessation:  A prescription for an FDA-approved tobacco cessation medication provided at discharge  Current Medications:  Current Facility-Administered Medications  Medication Dose Route Frequency Provider Last Rate Last Admin   acetaminophen (TYLENOL) tablet 650 mg  650 mg Oral Q6H PRN Nwoko, Uchenna E, PA   650 mg at 04/28/22 1801   alum & mag hydroxide-simeth (MAALOX/MYLANTA) 200-200-20 MG/5ML suspension 30 mL  30 mL Oral Q4H PRN Nwoko, Uchenna E, PA       amLODipine (NORVASC) tablet 10 mg  10 mg Oral Daily Vernard Gambles H, NP   10 mg at 05/02/22 0906   DULoxetine (CYMBALTA) DR capsule 60 mg  60 mg Oral Daily Ardis Hughs, NP   60 mg at 05/02/22 0905   hydrOXYzine (ATARAX) tablet 25 mg  25 mg Oral TID PRN Nwoko, Uchenna E, PA   25 mg at 04/30/22 2107   ibuprofen (ADVIL) tablet 600 mg  600 mg Oral Q6H PRN Ardis Hughs, NP   600 mg at 04/30/22 2107   magnesium hydroxide (MILK OF MAGNESIA) suspension 30 mL  30 mL Oral Daily PRN Nwoko, Uchenna E, PA       nicotine (NICODERM CQ - dosed in mg/24 hours) patch 21 mg  21 mg Transdermal Daily Vernard Gambles H, NP   21 mg at 05/02/22 0905   risperiDONE (RISPERDAL) tablet 2 mg  2 mg Oral BID Mariel Craft, MD   2 mg at 05/02/22 5956   traZODone (DESYREL) tablet 100 mg  100 mg Oral QHS PRN Ardis Hughs, NP   100 mg at 05/01/22 2109   Current Outpatient Medications  Medication Sig Dispense Refill   [START ON 05/03/2022] DULoxetine (CYMBALTA) 60 MG capsule Take 1 capsule (60 mg total) by mouth daily. 30 capsule 0   hydrOXYzine (ATARAX) 25 MG tablet Take 1 tablet (25 mg total) by mouth 3 (three) times daily as needed for anxiety. 30 tablet 0   [START ON 05/03/2022]  nicotine (NICODERM CQ - DOSED IN MG/24 HOURS) 21 mg/24hr patch Place 1 patch (21 mg total) onto the skin daily. 28 patch 0   risperiDONE (RISPERDAL) 2 MG tablet Take 1 tablet (2 mg total) by mouth 2 (two) times daily. 60 tablet 0   traZODone (DESYREL) 100 MG tablet Take 1 tablet (100 mg total) by mouth at bedtime as needed for sleep. 30 tablet 0    PTA Medications: (Not in a hospital admission)   Musculoskeletal  Strength & Muscle Tone: within normal limits Gait & Station: normal Patient leans: N/A  Psychiatric Specialty Exam  Presentation  General Appearance: Appropriate for Environment; Casual; Neat  Eye Contact:Good  Speech:Clear and Coherent; Normal Rate  Speech Volume:Normal  Handedness:Right   Mood and Affect  Mood:Euthymic  Affect:Appropriate; Congruent   Thought Process  Thought Processes:Coherent  Descriptions of Associations:Intact  Orientation:Full (Time, Place and Person)  Thought Content:Logical     Hallucinations:Hallucinations: None Description of Auditory Hallucinations: son's voice.  less since starting Risperdal  Ideas of Reference:None  Suicidal Thoughts:Suicidal Thoughts: No  Homicidal Thoughts:Homicidal Thoughts: No   Sensorium  Memory:Immediate Fair; Recent Fair; Remote Fair  Judgment:Fair  Insight:Fair   Executive Functions  Concentration:Good  Attention Span:Good  Recall:Good  Fund of Knowledge:Good  Language:Good   Psychomotor Activity  Psychomotor Activity:Psychomotor Activity: Normal   Assets  Assets:Communication Skills; Desire for Improvement; Resilience; Social Support   Sleep  Sleep:Sleep: Good   No data recorded  Physical Exam  Physical Exam ROS Blood pressure 109/60, pulse 83, temperature 97.8 F (36.6 C), temperature source Oral, resp. rate 20, SpO2 99 %. There is no height or weight on file to calculate BMI.  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  Patients presenting with no risk factors but with morbid ruminations; may be classified as minimal risk based on the severity of the depressive symptoms  Plan Of Care/Follow-up recommendations:  Activity:  ad lib Diet:  as tolerated  Disposition:  Patient is instructed prior to discharge to: Take all medications as prescribed by his/her mental healthcare provider. Report any adverse effects and or reactions from the medicines to his/her outpatient provider promptly. Keep all scheduled appointments, to ensure that you are getting refills on time and to avoid any interruption in your medication.  If you are unable to keep an appointment call to reschedule.  Be sure to follow-up with resources and follow-up appointments provided.  Patient has been instructed & cautioned: To not engage in alcohol and or illegal drug use while on prescription medicines. In the event of worsening symptoms, patient is instructed to call the crisis hotline, 911 and or go to the nearest ED for appropriate evaluation and treatment of symptoms. To follow-up with his/her primary care provider for your other medical issues, concerns and or health care needs.    She was able to engage in safety planning including plan to return to nearest emergency room or contact emergency services if she feels unable to maintain her own safety or the safety of others. Patient had no further  questions, comments, or concerns.  Discharge into care of room-mate, who agrees to maintain patient safety. Patient aware to return to nearest crisis center, ED or to call 911 for worsening symptoms of depression, suicidal or homicidal thoughts or AVH.   Mariel CraftSHEILA M Shellie Rogoff, MD 05/02/2022, 3:27 PM

## 2022-05-02 NOTE — ED Notes (Signed)
Patient awake early and ate breakfast.  No complaints or distress.  Calm and in control.  Will monitor.

## 2022-05-02 NOTE — Clinical Social Work Psych Note (Addendum)
LCSW Update/Discharge Note   Shannon Burgess reports feeling "okay" this morning, however she does report she is very tired today. Shannon Burgess denied having any SI, HI or AVH. Shannon Burgess reports that she did not make any contact with the resources.   Shannon Burgess reports that she is agreeable with discharge today. She shared that she will more than likely discharge to her son's home. LCSW questioned whether she thought that would be a good idea. Shannon Burgess reports that  she and her son will be alright and that is where she wants to be dischrged to, if she cannot get in contact with her roommate, Shannon Burgess.   LCSW attempted to contact the patient's roommate/friend, Shannon Burgess [phone]), however there was no answer. Phone's mailbox was full, therefore LCSW was unable to leave a voicemail requesting a call back.   LCSW has authorized a taxi voucher to assist with transportation needs. Taxi voucher has been provided to [employer].   Baldo Daub, MSW, LCSW Clinical Child psychotherapist (Facility Based Crisis) [facility]

## 2022-05-02 NOTE — ED Notes (Signed)
Pt is currently sleeping, no distress noted, environmental check complete, will continue to monitor patient for safety. ? ?

## 2022-05-03 ENCOUNTER — Other Ambulatory Visit: Payer: Self-pay

## 2022-05-03 ENCOUNTER — Observation Stay (HOSPITAL_COMMUNITY): Payer: Medicare Other

## 2022-05-03 ENCOUNTER — Observation Stay (HOSPITAL_COMMUNITY)
Admission: EM | Admit: 2022-05-03 | Discharge: 2022-05-04 | Disposition: A | Discharge status: Home / Self Care | Payer: Medicare Other | Attending: Infectious Diseases | Admitting: Infectious Diseases

## 2022-05-03 ENCOUNTER — Other Ambulatory Visit (HOSPITAL_COMMUNITY): Payer: Medicare Other

## 2022-05-03 ENCOUNTER — Emergency Department (HOSPITAL_COMMUNITY): Payer: Medicare Other

## 2022-05-03 ENCOUNTER — Observation Stay (HOSPITAL_BASED_OUTPATIENT_CLINIC_OR_DEPARTMENT_OTHER): Payer: Medicare Other

## 2022-05-03 ENCOUNTER — Encounter (HOSPITAL_COMMUNITY): Payer: Self-pay

## 2022-05-03 ENCOUNTER — Other Ambulatory Visit (HOSPITAL_COMMUNITY): Payer: Self-pay

## 2022-05-03 DIAGNOSIS — I10 Essential (primary) hypertension: Secondary | ICD-10-CM | POA: Diagnosis not present

## 2022-05-03 DIAGNOSIS — D72829 Elevated white blood cell count, unspecified: Secondary | ICD-10-CM | POA: Diagnosis not present

## 2022-05-03 DIAGNOSIS — I2510 Atherosclerotic heart disease of native coronary artery without angina pectoris: Secondary | ICD-10-CM

## 2022-05-03 DIAGNOSIS — Z20822 Contact with and (suspected) exposure to covid-19: Secondary | ICD-10-CM | POA: Diagnosis not present

## 2022-05-03 DIAGNOSIS — I4891 Unspecified atrial fibrillation: Secondary | ICD-10-CM

## 2022-05-03 DIAGNOSIS — Z79899 Other long term (current) drug therapy: Secondary | ICD-10-CM | POA: Diagnosis not present

## 2022-05-03 DIAGNOSIS — N179 Acute kidney failure, unspecified: Secondary | ICD-10-CM | POA: Insufficient documentation

## 2022-05-03 DIAGNOSIS — R64 Cachexia: Secondary | ICD-10-CM | POA: Diagnosis not present

## 2022-05-03 DIAGNOSIS — D649 Anemia, unspecified: Secondary | ICD-10-CM

## 2022-05-03 DIAGNOSIS — J849 Interstitial pulmonary disease, unspecified: Secondary | ICD-10-CM | POA: Diagnosis not present

## 2022-05-03 DIAGNOSIS — I214 Non-ST elevation (NSTEMI) myocardial infarction: Secondary | ICD-10-CM | POA: Diagnosis not present

## 2022-05-03 DIAGNOSIS — R778 Other specified abnormalities of plasma proteins: Secondary | ICD-10-CM

## 2022-05-03 DIAGNOSIS — R918 Other nonspecific abnormal finding of lung field: Secondary | ICD-10-CM | POA: Diagnosis not present

## 2022-05-03 DIAGNOSIS — M19011 Primary osteoarthritis, right shoulder: Secondary | ICD-10-CM | POA: Insufficient documentation

## 2022-05-03 DIAGNOSIS — F1410 Cocaine abuse, uncomplicated: Secondary | ICD-10-CM

## 2022-05-03 DIAGNOSIS — R531 Weakness: Secondary | ICD-10-CM | POA: Diagnosis present

## 2022-05-03 DIAGNOSIS — F1510 Other stimulant abuse, uncomplicated: Secondary | ICD-10-CM

## 2022-05-03 HISTORY — DX: Hyperlipidemia, unspecified: E78.5

## 2022-05-03 HISTORY — DX: Atherosclerotic heart disease of native coronary artery without angina pectoris: I25.10

## 2022-05-03 HISTORY — DX: Tobacco use: Z72.0

## 2022-05-03 HISTORY — DX: Other psychoactive substance abuse, uncomplicated: F19.10

## 2022-05-03 HISTORY — DX: Essential (primary) hypertension: I10

## 2022-05-03 LAB — CBC WITH DIFFERENTIAL/PLATELET
Abs Immature Granulocytes: 0.12 10*3/uL — ABNORMAL HIGH (ref 0.00–0.07)
Basophils Absolute: 0.1 10*3/uL (ref 0.0–0.1)
Basophils Relative: 0 %
Eosinophils Absolute: 0 10*3/uL (ref 0.0–0.5)
Eosinophils Relative: 0 %
HCT: 32 % — ABNORMAL LOW (ref 36.0–46.0)
Hemoglobin: 9.8 g/dL — ABNORMAL LOW (ref 12.0–15.0)
Immature Granulocytes: 1 %
Lymphocytes Relative: 5 %
Lymphs Abs: 0.7 10*3/uL (ref 0.7–4.0)
MCH: 29.3 pg (ref 26.0–34.0)
MCHC: 30.6 g/dL (ref 30.0–36.0)
MCV: 95.8 fL (ref 80.0–100.0)
Monocytes Absolute: 1.9 10*3/uL — ABNORMAL HIGH (ref 0.1–1.0)
Monocytes Relative: 13 %
Neutro Abs: 11.6 10*3/uL — ABNORMAL HIGH (ref 1.7–7.7)
Neutrophils Relative %: 81 %
Platelets: 244 10*3/uL (ref 150–400)
RBC: 3.34 MIL/uL — ABNORMAL LOW (ref 3.87–5.11)
RDW: 13 % (ref 11.5–15.5)
WBC: 14.4 10*3/uL — ABNORMAL HIGH (ref 4.0–10.5)
nRBC: 0 % (ref 0.0–0.2)

## 2022-05-03 LAB — COMPREHENSIVE METABOLIC PANEL
ALT: 24 U/L (ref 0–44)
AST: 34 U/L (ref 15–41)
Albumin: 3.6 g/dL (ref 3.5–5.0)
Alkaline Phosphatase: 73 U/L (ref 38–126)
Anion gap: 10 (ref 5–15)
BUN: 34 mg/dL — ABNORMAL HIGH (ref 8–23)
CO2: 24 mmol/L (ref 22–32)
Calcium: 8.5 mg/dL — ABNORMAL LOW (ref 8.9–10.3)
Chloride: 105 mmol/L (ref 98–111)
Creatinine, Ser: 1.01 mg/dL — ABNORMAL HIGH (ref 0.44–1.00)
GFR, Estimated: >60 mL/min (ref >60)
Glucose, Bld: 69 mg/dL — ABNORMAL LOW (ref 70–99)
Potassium: 4.5 mmol/L (ref 3.5–5.1)
Sodium: 139 mmol/L (ref 135–145)
Total Bilirubin: 0.1 mg/dL — ABNORMAL LOW (ref 0.3–1.2)
Total Protein: 6.4 g/dL — ABNORMAL LOW (ref 6.5–8.1)

## 2022-05-03 LAB — CBG MONITORING, ED: Glucose-Capillary: 171 mg/dL — ABNORMAL HIGH (ref 70–99)

## 2022-05-03 LAB — LIPID PANEL
Cholesterol: 140 mg/dL (ref 0–200)
HDL: 82 mg/dL (ref >40)
LDL Cholesterol: 47 mg/dL (ref 0–99)
Total CHOL/HDL Ratio: 1.7 RATIO
Triglycerides: 56 mg/dL (ref <150)
VLDL: 11 mg/dL (ref 0–40)

## 2022-05-03 LAB — PHOSPHORUS: Phosphorus: 5.4 mg/dL — ABNORMAL HIGH (ref 2.5–4.6)

## 2022-05-03 LAB — URINALYSIS, ROUTINE W REFLEX MICROSCOPIC
Bilirubin Urine: NEGATIVE
Glucose, UA: NEGATIVE mg/dL
Hgb urine dipstick: NEGATIVE
Ketones, ur: NEGATIVE mg/dL
Leukocytes,Ua: NEGATIVE
Nitrite: NEGATIVE
Protein, ur: NEGATIVE mg/dL
Specific Gravity, Urine: 1.015 (ref 1.005–1.030)
pH: 5 (ref 5.0–8.0)

## 2022-05-03 LAB — RAPID URINE DRUG SCREEN, HOSP PERFORMED
Amphetamines: NOT DETECTED
Barbiturates: NOT DETECTED
Benzodiazepines: NOT DETECTED
Cocaine: POSITIVE — AB
Opiates: POSITIVE — AB
Tetrahydrocannabinol: NOT DETECTED

## 2022-05-03 LAB — HEPATITIS C ANTIBODY: HCV Ab: NONREACTIVE

## 2022-05-03 LAB — FOLATE: Folate: >40 ng/mL (ref >5.9)

## 2022-05-03 LAB — HEPATITIS B SURFACE ANTIGEN: Hepatitis B Surface Ag: NONREACTIVE

## 2022-05-03 LAB — FERRITIN: Ferritin: 11 ng/mL (ref 11–307)

## 2022-05-03 LAB — ECHOCARDIOGRAM COMPLETE
AR max vel: 1.83 cm2
AV Peak grad: 9.4 mmHg
Ao pk vel: 1.53 m/s
Area-P 1/2: 3.87 cm2
Height: 63 in
S' Lateral: 2 cm
Weight: 1360 oz

## 2022-05-03 LAB — TROPONIN I (HIGH SENSITIVITY)
Troponin I (High Sensitivity): 62 ng/L — ABNORMAL HIGH (ref <18)
Troponin I (High Sensitivity): 155 ng/L (ref <18)
Troponin I (High Sensitivity): 368 ng/L (ref <18)
Troponin I (High Sensitivity): 397 ng/L (ref <18)
Troponin I (High Sensitivity): 399 ng/L (ref <18)
Troponin I (High Sensitivity): 400 ng/L (ref <18)

## 2022-05-03 LAB — HEPARIN LEVEL (UNFRACTIONATED): Heparin Unfractionated: <0.1 IU/mL — ABNORMAL LOW (ref 0.30–0.70)

## 2022-05-03 LAB — MAGNESIUM: Magnesium: 2.1 mg/dL (ref 1.7–2.4)

## 2022-05-03 LAB — RESP PANEL BY RT-PCR (FLU A&B, COVID) ARPGX2
Influenza A by PCR: NEGATIVE
Influenza B by PCR: NEGATIVE
SARS Coronavirus 2 by RT PCR: NEGATIVE

## 2022-05-03 LAB — IRON AND TIBC
Iron: 67 ug/dL (ref 28–170)
Saturation Ratios: 13 % (ref 10.4–31.8)
TIBC: 532 ug/dL — ABNORMAL HIGH (ref 250–450)
UIBC: 465 ug/dL

## 2022-05-03 LAB — VITAMIN B12: Vitamin B-12: 188 pg/mL (ref 180–914)

## 2022-05-03 LAB — T4, FREE: Free T4: 0.81 ng/dL (ref 0.61–1.12)

## 2022-05-03 LAB — BRAIN NATRIURETIC PEPTIDE: B Natriuretic Peptide: 77.8 pg/mL (ref 0.0–100.0)

## 2022-05-03 LAB — HIV ANTIBODY (ROUTINE TESTING W REFLEX): HIV Screen 4th Generation wRfx: NONREACTIVE

## 2022-05-03 LAB — TSH: TSH: 3.057 u[IU]/mL (ref 0.350–4.500)

## 2022-05-03 LAB — HEPATITIS B CORE ANTIBODY, TOTAL: Hep B Core Total Ab: REACTIVE — AB

## 2022-05-03 MED ORDER — HEPARIN (PORCINE) 25000 UT/250ML-% IV SOLN
550.0000 [IU]/h | INTRAVENOUS | Status: DC
  Administered 2022-05-03: 550 [IU]/h via INTRAVENOUS
  Filled 2022-05-03: qty 250

## 2022-05-03 MED ORDER — DULOXETINE HCL 60 MG PO CPEP
60.0000 mg | ORAL_CAPSULE | Freq: Every day | ORAL | Status: DC
  Administered 2022-05-03 – 2022-05-04 (×2): 60 mg via ORAL
  Filled 2022-05-03 (×2): qty 1

## 2022-05-03 MED ORDER — FOLIC ACID 1 MG PO TABS
1.0000 mg | ORAL_TABLET | Freq: Every day | ORAL | Status: DC
  Administered 2022-05-03 – 2022-05-04 (×2): 1 mg via ORAL
  Filled 2022-05-03 (×2): qty 1

## 2022-05-03 MED ORDER — RISPERIDONE 2 MG PO TABS
2.0000 mg | ORAL_TABLET | Freq: Two times a day (BID) | ORAL | Status: DC
  Administered 2022-05-03 – 2022-05-04 (×2): 2 mg via ORAL
  Filled 2022-05-03 (×3): qty 1

## 2022-05-03 MED ORDER — ADULT MULTIVITAMIN W/MINERALS CH
1.0000 | ORAL_TABLET | Freq: Every day | ORAL | Status: DC
  Administered 2022-05-03 – 2022-05-04 (×2): 1 via ORAL
  Filled 2022-05-03 (×2): qty 1

## 2022-05-03 MED ORDER — THIAMINE HCL 100 MG PO TABS
100.0000 mg | ORAL_TABLET | Freq: Every day | ORAL | Status: DC
  Administered 2022-05-03 – 2022-05-04 (×2): 100 mg via ORAL
  Filled 2022-05-03 (×2): qty 1

## 2022-05-03 MED ORDER — HEPARIN BOLUS VIA INFUSION
2000.0000 [IU] | Freq: Once | INTRAVENOUS | Status: AC
  Administered 2022-05-03: 2000 [IU] via INTRAVENOUS
  Filled 2022-05-03: qty 2000

## 2022-05-03 MED ORDER — ASPIRIN 81 MG PO TBEC
81.0000 mg | DELAYED_RELEASE_TABLET | Freq: Every day | ORAL | Status: DC
  Administered 2022-05-03 – 2022-05-04 (×2): 81 mg via ORAL
  Filled 2022-05-03 (×2): qty 1

## 2022-05-03 MED ORDER — SODIUM CHLORIDE 0.9% FLUSH
3.0000 mL | Freq: Two times a day (BID) | INTRAVENOUS | Status: DC
  Administered 2022-05-03 – 2022-05-04 (×2): 3 mL via INTRAVENOUS

## 2022-05-03 MED ORDER — ACETAMINOPHEN 325 MG PO TABS
650.0000 mg | ORAL_TABLET | Freq: Four times a day (QID) | ORAL | Status: DC | PRN
  Administered 2022-05-03: 650 mg via ORAL
  Filled 2022-05-03: qty 2

## 2022-05-03 MED ORDER — DILTIAZEM HCL-DEXTROSE 125-5 MG/125ML-% IV SOLN (PREMIX)
5.0000 mg/h | INTRAVENOUS | Status: DC
  Filled 2022-05-03: qty 125

## 2022-05-03 MED ORDER — DILTIAZEM HCL 25 MG/5ML IV SOLN
10.0000 mg | Freq: Once | INTRAVENOUS | Status: DC
  Filled 2022-05-03: qty 5

## 2022-05-03 MED ORDER — HEPARIN (PORCINE) 25000 UT/250ML-% IV SOLN
850.0000 [IU]/h | INTRAVENOUS | Status: DC
  Administered 2022-05-03: 550 [IU]/h via INTRAVENOUS

## 2022-05-03 MED ORDER — ACETAMINOPHEN 650 MG RE SUPP: 650.0000 mg | Freq: Four times a day (QID) | RECTAL | Status: DC | PRN

## 2022-05-03 MED ORDER — SODIUM CHLORIDE 0.9 % IV BOLUS
1000.0000 mL | Freq: Once | INTRAVENOUS | Status: AC
  Administered 2022-05-03: 1000 mL via INTRAVENOUS

## 2022-05-03 MED ORDER — ATORVASTATIN CALCIUM 40 MG PO TABS: 40.0000 mg | ORAL_TABLET | Freq: Every day | ORAL | Status: DC

## 2022-05-03 MED ORDER — ATORVASTATIN CALCIUM 80 MG PO TABS
80.0000 mg | ORAL_TABLET | Freq: Every day | ORAL | Status: DC
  Administered 2022-05-03: 80 mg via ORAL
  Filled 2022-05-03: qty 1

## 2022-05-03 MED ORDER — APIXABAN 5 MG PO TABS: 5.0000 mg | ORAL_TABLET | Freq: Two times a day (BID) | ORAL | Status: DC

## 2022-05-03 MED ORDER — POLYETHYLENE GLYCOL 3350 17 G PO PACK: 17.0000 g | PACK | Freq: Every day | ORAL | Status: DC | PRN

## 2022-05-03 MED ORDER — ENSURE ENLIVE PO LIQD
237.0000 mL | Freq: Three times a day (TID) | ORAL | Status: DC
  Administered 2022-05-03 – 2022-05-04 (×2): 237 mL via ORAL

## 2022-05-03 NOTE — ED Notes (Signed)
Patient now in NSR w/ HR in the 90s. Dr. Huel Cote at bedside. Holding off on cardizem at this time.

## 2022-05-03 NOTE — TOC Benefit Eligibility Note (Signed)
Patient Product/process development scientist completed.    The patient is currently admitted and upon discharge could be taking Eliquis 5 mg.  The current 30 day co-pay is, $10.35.   The patient is currently admitted and upon discharge could be taking Xarelto 20 mg.  The current 30 day co-pay is, $10.35.   The patient is insured through [employer] Part D     Roland Earl, CPhT Pharmacy Patient Advocate Specialist [facility] Patient Advocate Team Direct Number: [phone]  Fax: [phone]

## 2022-05-03 NOTE — Plan of Care (Signed)

## 2022-05-03 NOTE — Progress Notes (Signed)
Initial Nutrition Assessment  DOCUMENTATION CODES:  Underweight  INTERVENTION:  Continue regular diet, encourage PO intake Ensure Enlive po TID, each supplement provides 350 kcal and 20 grams of protein. Magic cup TID with meals, each supplement provides 290 kcal and 9 grams of protein MVI with minerals daily  NUTRITION DIAGNOSIS:  Underweight related to  (inadequate intake in the context of drug abuse) as evidenced by  (BMI of 15.06).  GOAL:  Patient will meet greater than or equal to 90% of their needs  MONITOR:  PO intake, Supplement acceptance, Weight trends, Labs  REASON FOR ASSESSMENT:  Consult Assessment of nutrition requirement/status  ASSESSMENT:  Pt with hx of drug abuse presented to ED with weakness  Pt found to be in a-fib in ED but converted to NSR. High troponin also noted. Urine positive for cocaine and opiates.  Pt undergoing ECHO at the attempted time of assessment. Limited information available in chart, no H&P available yet. No documents to review in care everywhere or in chart review.  Pt is underweight and likely malnourished. Vitamin regimen is in place. Will add nutrition supplements and follow-up with pt for exam and hx.   Nutritionally Relevant Medications: Scheduled Meds:  folic acid  1 mg Oral Daily   multivitamin with minerals  1 tablet Oral Daily   thiamine  100 mg Oral Daily   PRN Meds: polyethylene glycol  Labs Reviewed: Glucose 69 BUN 34, creatinine 1.01 Phosphorus 5.4  NUTRITION - FOCUSED PHYSICAL EXAM: Defer to in-person assessment  Diet Order:   Diet Order             Diet regular Room service appropriate? Yes; Fluid consistency: Thin  Diet effective now                   EDUCATION NEEDS:  No education needs have been identified at this time  Skin:  Skin Assessment:  (No assessment to review at this time)  Last BM:  unsure  Height:  Ht Readings from Last 1 Encounters:  05/03/22 5\' 3"  (1.6 m)    Weight:  Wt  Readings from Last 1 Encounters:  05/03/22 38.6 kg    Ideal Body Weight:  52.3 kg  BMI:  Body mass index is 15.06 kg/m.  Estimated Nutritional Needs:  Kcal:  1400-1600 kcal/d Protein:  70-90 g/d Fluid:  1.5-1.8L/d   05/05/22, RD, LDN Clinical Dietitian RD pager # available in [facility]  After hours/weekend pager # available in [facility]

## 2022-05-03 NOTE — Progress Notes (Signed)
ANTICOAGULATION CONSULT NOTE - Follow Up Consult  Pharmacy Consult for Heparin Indication: atrial fibrillation  No Known Allergies  Patient Measurements: Height: 5\' 3"  (160 cm) Weight: 45.2 kg (99 lb 11.2 oz) IBW/kg (Calculated) : 52.4 Heparin Dosing Weight: 38.6 kg   Vital Signs: Temp: 98.6 F (37 C) (05/26 1411) Temp Source: Oral (05/26 1411) BP: 122/100 (05/26 1200) Pulse Rate: 90 (05/26 1300)  Labs: Recent Labs    05/03/22 0945 05/03/22 1236  HGB 9.8*  --   HCT 32.0*  --   PLT 244  --   CREATININE 1.01*  --   TROPONINIHS 62* 155*     Estimated Creatinine Clearance: 37.5 mL/min (A) (by C-G formula based on SCr of 1.01 mg/dL (H)).   Assessment: 71 yo female presented on 05/03/2022 with generalized weakness and found to be in afib. No anticoagulation prior to admission. Hgb 9.8. Plt wnl.   Apixaban ordered this afternoon but never given. New orders to resume heparin.   Goal of Therapy:  Heparin level 0.3-0.7 units/ml Monitor platelets by anticoagulation protocol: Yes   Plan:  Restart heparin 550 units/hr  Check 6 hr heparin level  Monitor heparin level, CBC and s.s of bleeding daily   Erin Hearing PharmD., BCPS Clinical Pharmacist 05/03/2022 4:33 PM

## 2022-05-03 NOTE — Progress Notes (Signed)
ANTICOAGULATION CONSULT NOTE - Follow Up Consult  Pharmacy Consult for Heparin Indication: atrial fibrillation  No Known Allergies  Patient Measurements: Height: 5\' 3"  (160 cm) Weight: 38.6 kg (85 lb) IBW/kg (Calculated) : 52.4 Heparin Dosing Weight: 38.6 kg   Vital Signs: Temp: 97.3 F (36.3 C) (05/26 0949) Temp Source: Oral (05/26 0949) BP: 106/71 (05/26 1115) Pulse Rate: 117 (05/26 1115)  Labs: Recent Labs    05/03/22 0945  HGB 9.8*  HCT 32.0*  PLT 244  CREATININE 1.01*  TROPONINIHS 62*    Estimated Creatinine Clearance: 32 mL/min (A) (by C-G formula based on SCr of 1.01 mg/dL (H)).   Assessment: 71 yo female presented on 05/03/2022 with generalized weakness and found to be in afib. No anticoagulation prior to admission. Hgb 9.8. Plt wnl.   Goal of Therapy:  Heparin level 0.3-0.7 units/ml Monitor platelets by anticoagulation protocol: Yes   Plan:  Heparin 2000 unit bolus followed by heparin 550 units/hr  Check 6 hr heparin level  Monitor heparin level, CBC and s.s of bleeding daily   Cristela Felt, PharmD, BCPS Clinical Pharmacist 05/03/2022 11:44 AM

## 2022-05-03 NOTE — Progress Notes (Signed)
Critical Troponin communicated with MD
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
## 2022-05-03 NOTE — Consult Note (Addendum)
Cardiology Consultation:   Patient ID: Shannon Burgess MRN: [id number]; DOB: Jan 05, 1951  Admit date: 05/03/2022 Date of Consult: 05/03/2022  PCP:  Merryl Hacker, No   [facility] Providers Cardiologist:  New (Dr. Marlou Porch)  Patient Profile:   Shannon Burgess is a 71 y.o. female with a history of CAD with self-reported MI in 06/2011 s/p stenting x2 (no records of this), hypertension, hyperlipidemia, depression, anxiety with panic attacks, and polysubstance abuse (tobacco, cocaine, methamphetamine, heroine) who is being seen for the evaluation of atrial fibrillation and elevated troponin at the request of Dr. Johnnye Sima.  History of Present Illness:   Shannon Burgess is a 71 year old female with the above history.  Patient has a history CAD and she reports she had a MI back in 06/2011 at which time she reportedly had 2 stents placed.  She states this was done at [facility] in [place] but I see no records of this in [place].  She states she was going to see Cardiology following this but then "COVID happened."  She also has a history of hypertension and hyperlipidemia but no known diabetes.  She has not seen a medical provider in many years.  She has a history of polysubstance abuse and was recently admitted at the behavioral health hospital from 04/27/2022 to 05/02/2022 for depression and methamphetamine use at which time she was started on Risperidone for hallucinations. She reports using methamphetamine at least every other day. She also reports daily marijuana use which she states is good for her heart and panic attacks. She states last night she used cocaine for the first time in a while. She states she also used heroine for the first time last night. In addition, she has a 20 history of tobacco use and currently smokes 1/2 pack of cigarettes per day. She denies any alcohol use.  Patient presented to the ED today for further evaluation of generalized weakness. Upon arrival to the ED, she was found to be in  new onset atrial fibrillation with RVR. EKG showed atrial fibrillation, rate 109 bpm, with no acute ischemic changes. High-sensitivity troponin 62 >> 155. BNP normal. Chest x-ray showed diffuse interstitial opacities suggesting mild pulmonary edema or atypical infectious/inflammatory process. WBC 14.4, Hgb 9.8, Plts 244. Na 139, K 4.5, Glucose 69, BUN 34, Cr 1.01. Total Protein 6.4, Albumin 3.6, AST 34, ALT 24, Alk Phos 73, Total Bili 0.1. TSH normal. Rapid drug screen positive for opiates and cocaine. Patient admitted for further evaluation/ management and Cardiology consulted for further evaluation.   At the time of this evaluation, patient is resting comfortably in no acute distress. She states she was in her usual stat of health until early this morning. She was discharged from the behavioral health unit yesterday and then did cocaine and heroine last night. She states her son's roommate called 911 this morning due to profound weakness and the fact that she was not responding. She denies any chest pain, shortness of breath, orthopnea, PND, or palpitations. She states that "looking back" she thinks she had some lightheadedness/dizziness yesterday but no syncope. She reports some mild lower extremity swelling a couple of weeks ago but none since. No fevers. She reports a non-productive cough while she was at the behavioral health facility but no nasal congestion, nausea, vomiting, or diarrhea. No abnormal bleeding in urine or stools.  Past Medical History:  Diagnosis Date   CAD (coronary artery disease)    Hyperlipidemia    Hypertension    Polysubstance abuse ([place])  cocaine, heroine, methamphetamine   Tobacco abuse     History reviewed. No pertinent surgical history.     Inpatient Medications: Scheduled Meds:  aspirin EC  81 mg Oral Daily   atorvastatin  80 mg Oral QHS   DULoxetine  60 mg Oral Daily   feeding supplement  237 mL Oral TID BM   folic acid  1 mg Oral Daily   multivitamin  with minerals  1 tablet Oral Daily   risperiDONE  2 mg Oral BID   sodium chloride flush  3 mL Intravenous Q12H   thiamine  100 mg Oral Daily   Continuous Infusions:  heparin 550 Units/hr (05/03/22 1800)   PRN Meds: acetaminophen **OR** acetaminophen, polyethylene glycol  Allergies:   No Known Allergies  Social History:   Social History   Socioeconomic History   Marital status: Divorced    Spouse name: Not on file   Number of children: Not on file   Years of education: Not on file   Highest education level: Not on file  Occupational History   Not on file  Tobacco Use   Smoking status: Not on file   Smokeless tobacco: Not on file  Substance and Sexual Activity   Alcohol use: Not on file   Drug use: Not on file   Sexual activity: Not on file  Other Topics Concern   Not on file  Social History Narrative   Not on file   Social Determinants of Health   Financial Resource Strain: Not on file  Food Insecurity: Not on file  Transportation Needs: Not on file  Physical Activity: Not on file  Stress: Not on file  Social Connections: Not on file  Intimate Partner Violence: Not on file    Family History:   Family History  Problem Relation Age of Onset   CAD Father    Heart attack Father    Stroke Brother      ROS:  Please see the history of present illness.  Review of Systems  Constitutional:  Negative for fever.  HENT:  Negative for congestion.   Respiratory:  Positive for cough. Negative for shortness of breath.   Cardiovascular:  Negative for chest pain, palpitations, leg swelling and PND.  Gastrointestinal:  Negative for blood in stool, melena, nausea and vomiting.  Genitourinary:  Negative for hematuria.  Musculoskeletal:  Negative for myalgias.  Neurological:  Positive for dizziness and weakness. Negative for loss of consciousness.  Endo/Heme/Allergies:  Does not bruise/bleed easily.  Psychiatric/Behavioral:  Positive for substance abuse.     Physical  Exam/Data:   Vitals:   05/03/22 1200 05/03/22 1300 05/03/22 1411 05/03/22 1419  BP: (!) 122/100     Pulse: 97 90    Resp: 17 20 16    Temp:   98.6 F (37 C)   TempSrc:   Oral   SpO2: 94% 94%    Weight:    45.2 kg  Height:    5' 3"  (1.6 m)    Intake/Output Summary (Last 24 hours) at 05/03/2022 1824 Last data filed at 05/03/2022 1800 Gross per 24 hour  Intake 37.95 ml  Output --  Net 37.95 ml      05/03/2022    2:19 PM 05/03/2022    9:13 AM  Last 3 Weights  Weight (lbs) 99 lb 11.2 oz 85 lb  Weight (kg) 45.224 kg 38.556 kg     Body mass index is 17.66 kg/m.  General: 72 y.o. thin Caucasian female resting comfortably in no  acute distress. Appears older than stated age. HEENT: Normocephalic and atraumatic. Sclera clear.  Neck: Supple. Prominent external jugular vein with possible JVD. Heart: RRR. Distinct S1 and S2. No murmurs, gallops, or rubs.  Lungs: No increased work of breathing. Mild crackles noted in bilateral bases. No wheezes or rhonchi.  Abdomen: Soft, non-distended, and non-tender to palpation.  MSK: Normal strength and tone for age. Extremities: No lower extremity edema.    Skin: Warm and dry. Neuro: Alert and oriented x3. No focal deficits. Psych: Normal affect. Responds appropriately.   EKG:  The following EKGs were personally reviewed: - Initial EKG from 05/03/2022 at 9:13am demonstrates atrial fibrillation, rate 109 bpm, with no acute ST/T changes. - Repeat EKG from 05/03/2022 at 12:03pm demonstrates normal sinus rhythm, rate 93 bpm, with isolated T waves in lead aVL. Telemetry:  Telemetry was personally reviewed and demonstrates:  Normal sinus rhythm with rates in the 80s to 90s.  Relevant CV Studies:  Echo pending.  Laboratory Data:  High Sensitivity Troponin:   Recent Labs  Lab 05/03/22 0945 05/03/22 1236 05/03/22 1625  TROPONINIHS 62* 155* 368*     Chemistry Recent Labs  Lab 04/27/22 2229 05/03/22 0945 05/03/22 1236  NA 139 139  --   K  3.9 4.5  --   CL 107 105  --   CO2 25 24  --   GLUCOSE 88 69*  --   BUN 23 34*  --   CREATININE 0.63 1.01*  --   CALCIUM 9.2 8.5*  --   MG  --   --  2.1  GFRNONAA >60 >60  --   ANIONGAP 7 10  --     Recent Labs  Lab 04/27/22 2229 05/03/22 0945  PROT 7.0 6.4*  ALBUMIN 4.1 3.6  AST 22 34  ALT 14 24  ALKPHOS 91 73  BILITOT <0.1* 0.1*   Lipids  Recent Labs  Lab 04/27/22 2229  CHOL 133  TRIG 86  HDL 91  LDLCALC 25  CHOLHDL 1.5    Hematology Recent Labs  Lab 04/27/22 2229 05/03/22 0945  WBC 10.0 14.4*  RBC 3.95 3.34*  HGB 11.9* 9.8*  HCT 35.2* 32.0*  MCV 89.1 95.8  MCH 30.1 29.3  MCHC 33.8 30.6  RDW 13.0 13.0  PLT 394 244   Thyroid  Recent Labs  Lab 05/03/22 0945  TSH 3.057  FREET4 0.81    BNP Recent Labs  Lab 05/03/22 1236  BNP 77.8    DDimer No results for input(s): DDIMER in the last 168 hours.   Radiology/Studies:  DG Chest Port 1 View  Result Date: 05/03/2022 CLINICAL DATA:  Fatigue EXAM: PORTABLE CHEST 1 VIEW COMPARISON:  None Available. FINDINGS: The cardiomediastinal silhouette is within normal limits in size. There is a coronary stent noted. Aortic arch calcifications. There are mild diffuse interstitial opacities. There is no large pleural effusion. There is no visible pneumothorax. There is no acute osseous abnormality. Right shoulder osteoarthritis with humeral head remodeling and subchondral sclerosis. IMPRESSION: Diffuse interstitial opacities suggesting mild pulmonary edema or atypical infectious/inflammatory process. Severe right glenohumeral osteoarthritis with flattening of the humeral head and subchondral sclerosis. Underlying AVN is possible. Electronically Signed   By: Maurine Simmering M.D.   On: 05/03/2022 10:01   ECHOCARDIOGRAM COMPLETE  Result Date: 05/03/2022    ECHOCARDIOGRAM REPORT   Patient Name:   ANALIAH DRUM Date of Exam: 05/03/2022 Medical Rec #:  [id number]    Height:       63.0 in  Accession #:    [id number]   Weight:        85.0 lb Date of Birth:  [birth year]    BSA:          1.344 m Patient Age:    6 years     BP:           113/67 mmHg Patient Gender: F            HR:           90 bpm. Exam Location:  Inpatient Procedure: 2D Echo, Cardiac Doppler and Color Doppler Indications:    Atrial fibrillation  History:        Patient has no prior history of Echocardiogram examinations.                 Arrythmias:Atrial Fibrillation.  Sonographer:    Jefferey Pica Referring Phys: [id number] DAN FLOYD IMPRESSIONS  1. Left ventricular ejection fraction, by estimation, is 60 to 65%. The left ventricle has normal function. The left ventricle has no regional wall motion abnormalities. There is moderate left ventricular hypertrophy. Left ventricular diastolic parameters are consistent with Grade I diastolic dysfunction (impaired relaxation).  2. Right ventricular systolic function is normal. The right ventricular size is normal. There is moderately elevated pulmonary artery systolic pressure. The estimated right ventricular systolic pressure is 45.6 mmHg.  3. The mitral valve is normal in structure. Mild to moderate mitral valve regurgitation. No evidence of mitral stenosis.  4. Tricuspid valve regurgitation is moderate to severe.  5. The aortic valve is tricuspid. There is mild thickening of the aortic valve. Aortic valve regurgitation is not visualized. No aortic stenosis is present.  6. The inferior vena cava is dilated in size with <50% respiratory variability, suggesting right atrial pressure of 15 mmHg. FINDINGS  Left Ventricle: Left ventricular ejection fraction, by estimation, is 60 to 65%. The left ventricle has normal function. The left ventricle has no regional wall motion abnormalities. The left ventricular internal cavity size was normal in size. There is  moderate left ventricular hypertrophy. Left ventricular diastolic parameters are consistent with Grade I diastolic dysfunction (impaired relaxation). Right Ventricle: The right  ventricular size is normal. No increase in right ventricular wall thickness. Right ventricular systolic function is normal. There is moderately elevated pulmonary artery systolic pressure. The tricuspid regurgitant velocity is 2.76 m/s, and with an assumed right atrial pressure of 15 mmHg, the estimated right ventricular systolic pressure is 25.6 mmHg. Left Atrium: Left atrial size was normal in size. Right Atrium: Right atrial size was normal in size. Pericardium: There is no evidence of pericardial effusion. Mitral Valve: The mitral valve is normal in structure. Mild to moderate mitral valve regurgitation. No evidence of mitral valve stenosis. Tricuspid Valve: The tricuspid valve is normal in structure. Tricuspid valve regurgitation is moderate to severe. No evidence of tricuspid stenosis. Aortic Valve: The aortic valve is tricuspid. There is mild thickening of the aortic valve. Aortic valve regurgitation is not visualized. No aortic stenosis is present. Aortic valve peak gradient measures 9.4 mmHg. Pulmonic Valve: The pulmonic valve was normal in structure. Pulmonic valve regurgitation is trivial. No evidence of pulmonic stenosis. Aorta: The aortic root is normal in size and structure. Venous: The inferior vena cava is dilated in size with less than 50% respiratory variability, suggesting right atrial pressure of 15 mmHg. IAS/Shunts: No atrial level shunt detected by color flow Doppler.  LEFT VENTRICLE PLAX 2D LVIDd:         3.50  cm   Diastology LVIDs:         2.00 cm   LV e' medial:    8.04 cm/s LV PW:         1.30 cm   LV E/e' medial:  12.0 LV IVS:        1.30 cm   LV e' lateral:   11.20 cm/s LVOT diam:     1.80 cm   LV E/e' lateral: 8.6 LV SV:         46 LV SV Index:   34 LVOT Area:     2.54 cm  RIGHT VENTRICLE             IVC RV Basal diam:  2.70 cm     IVC diam: 2.70 cm RV S prime:     18.90 cm/s TAPSE (M-mode): 1.6 cm LEFT ATRIUM             Index LA diam:        2.70 cm 2.01 cm/m LA Vol (A2C):   37.3 ml  27.75 ml/m LA Vol (A4C):   43.0 ml 31.99 ml/m LA Biplane Vol: 42.6 ml 31.69 ml/m  AORTIC VALVE                 PULMONIC VALVE AV Area (Vmax): 1.83 cm     PV Vmax:       0.73 m/s AV Vmax:        153.00 cm/s  PV Peak grad:  2.1 mmHg AV Peak Grad:   9.4 mmHg LVOT Vmax:      110.00 cm/s LVOT Vmean:     62.800 cm/s LVOT VTI:       0.180 m  AORTA Ao Root diam: 2.80 cm Ao Asc diam:  3.30 cm MITRAL VALVE                TRICUSPID VALVE MV Area (PHT): 3.87 cm     TR Peak grad:   30.5 mmHg MV Decel Time: 196 msec     TR Vmax:        276.00 cm/s MV E velocity: 96.30 cm/s MV A velocity: 123.00 cm/s  SHUNTS MV E/A ratio:  0.78         Systemic VTI:  0.18 m                             Systemic Diam: 1.80 cm Cherlynn Kaiser MD Electronically signed by Cherlynn Kaiser MD Signature Date/Time: 05/03/2022/5:19:18 PM    Final      Assessment and Plan:   New Onset Atrial Fibrillation Patient presented with weakness and some mild altered mental status was found to be in new onset atrial fibrillation. Spontaneously converted back to sinus rhythm without any medications. Electrolytes and TSH normal. Echo shows normal LV function. - Maintaining sinus rhythm. Rates in the 80s to 90s. - Would avoid beta-blocker given cocaine use. - BP has been soft at times. If this stabilizes, consider adding Cardizem CD. - CHA2DS-VASc = 4 (CAD, HTN, age, female). She has IV Heparin ordered. I do not think she is a great long term anticoagulation candidate with her polysubstance abuse. Will discuss with MD. - Discussed important of cessation of drug use.  CAD Elevated Troponin History of CAD with self reported MI in 2012 s/p stenting x2 (no records for this). High-sensitivity troponin mildly elevated at 62 >> 155. Most recent EKG shows isolated T wave inversions in lead  aVL. Echo shows LVEF of 60-65% with no regional wall motion abnormalities, moderate LVH, and grade 1 diastolic dysfunction. - Repeat troponin pending. - No chest pain or  dyspnea. - Suspect troponin elevation is likely due to demand ischemic in setting of atrial fibrillation, AKI, and cocaine abuse. Do not anticipate any need for ischemic evaluation.  Abnormal Chest X-Ray Chest x-ray on admission showed diffuse interstitial opacities suggesting mild pulmonary edema or atypical infectious/ inflammatory process. BNP normal. Echo showed showed LVEF of 60-65% with moderate LVH, and grade 1 diastolic dysfunction. - He does have mild crackles in bilateral bases as well as prominent external jugular vein but no lower extremity edema. - Will hold on diuresis for now and discuss with MD.  Hypertension History of hypertension but does not appear to be on any medications. She was treated with Amlodipine during recent admission at behavioral health hospital. BP labile ranging from 84/67 to 122/100. - Not currently on any medications. - Continue to monitor for now.  Hyperlipidemia Recent lipid panel on 04/27/2022: Total Cholesterol 133, Triglycerides 86, HDL 91, LDL 25. - Started on Lipitor 46m daily. OK to continue for now.  AKI Creatinine 1.01 on admission. Baseline around 0.6. - Primary team has ordered renal ultrasound. - Management per primary team.  Polysubstance Abuse History of tobacco, marijuana, methamphetamine, cocaine, and heroine abuse. Discharged from behavioral health hospital for treatment of depression and methamphetamine abuse on 05/02/2022 and then used cocaine and heroine that evening. UDS positive for opiates and cocaine. - Management per primary team.  Risk Assessment/Risk Scores:   TIMI Risk Score for Unstable Angina or Non-ST Elevation MI:   The patient's TIMI risk score is 5, which indicates a 26% risk of all cause mortality, new or recurrent myocardial infarction or need for urgent revascularization in the next 14 days.{  CHA2DS2-VASc Score = 4  This indicates a 4.8% annual risk of stroke. The patient's score is based upon: CHF History:  0 HTN History: 1 Diabetes History: 0 Stroke History: 0 Vascular Disease History: 1 Age Score: 1 Gender Score: 1   For questions or updates, please contact CFairfax StationPlease consult [email or web address] for contact info under    Signed, CDarreld Mclean PA-C  05/03/2022 6:24 PM  Personally seen and examined. Agree with above.  71year old with paroxysmal atrial fibrillation in the setting of recent heroin, methamphetamine use.  Her BMI is 17.6.  CHA2DS2-VASc of 4.  Currently receiving IV heparin.  Thankfully, she is converted back to normal sinus rhythm.  She is feeling well.  No chest pain no shortness of breath.  EKG personally reviewed.  Echocardiogram also personally reviewed.  Does not demonstrate any wall motion abnormalities.  Overall reassuring.  Her troponin did increase up to the 360 range which is secondary to demand ischemia in the setting of atrial fibrillation and underlying AKI.  No further cardiac work-up necessary.  Overall, I do not feel as though she is a good long-term anticoagulation candidate.  Eliquis could be a high risk medication for her given her polysubstance abuse/possible high risk of bleeding.  This of course could increase her risk of embolic stroke.  We will go ahead and sign off.  Please let u[place]oreaknow if we can be of further assistance.  MCandee Furbish MD

## 2022-05-03 NOTE — ED Provider Notes (Signed)
[facility] EMERGENCY DEPARTMENT Provider Note   CSN: [id number] Arrival date & time: 05/03/22  [id number]     History  Chief Complaint  Patient presents with   Atrial Fibrillation   Weakness    Shannon Burgess is a 71 y.o. female.  71 yo F with a chief complaint of fatigue.  This started abruptly last night she had about midnight.  She has trouble describing her symptoms.  She denies any chest pain denies difficulty breathing denies headache denies abdominal pain denies urinary symptoms.  Tells me that she was fine yesterday.  States that she has been eating and drinking normally.  Denies dark stool or blood in her stool.  Denies any recent medication changes.   Atrial Fibrillation  Weakness     Home Medications Prior to Admission medications   Medication Sig Start Date End Date Taking? Authorizing Provider  DULoxetine (CYMBALTA) 60 MG capsule Take 1 capsule (60 mg total) by mouth daily. 05/03/22   Lavella Hammock, MD  hydrOXYzine (ATARAX) 25 MG tablet Take 1 tablet (25 mg total) by mouth 3 (three) times daily as needed for anxiety. 05/02/22   Lavella Hammock, MD  nicotine (NICODERM CQ - DOSED IN MG/24 HOURS) 21 mg/24hr patch Place 1 patch (21 mg total) onto the skin daily. 05/03/22   Lavella Hammock, MD  risperiDONE (RISPERDAL) 2 MG tablet Take 1 tablet (2 mg total) by mouth 2 (two) times daily. 05/02/22   Lavella Hammock, MD  traZODone (DESYREL) 100 MG tablet Take 1 tablet (100 mg total) by mouth at bedtime as needed for sleep. 05/02/22   Lavella Hammock, MD      Allergies    Patient has no known allergies.    Review of Systems   Review of Systems  Neurological:  Positive for weakness.   Physical Exam Updated Vital Signs BP 122/78   Pulse 90   Temp (!) 97.3 F (36.3 C) (Oral)   Resp 11   Ht 5\' 3"  (1.6 m)   Wt 38.6 kg   SpO2 94%   BMI 15.06 kg/m  Physical Exam Vitals and nursing note reviewed.  Constitutional:      General: She is not in acute  distress.    Appearance: She is well-developed. She is not diaphoretic.     Comments: Appears much older than stated age  HENT:     Head: Normocephalic and atraumatic.  Eyes:     Pupils: Pupils are equal, round, and reactive to light.  Cardiovascular:     Rate and Rhythm: Normal rate and regular rhythm.     Heart sounds: No murmur heard.   No friction rub. No gallop.  Pulmonary:     Effort: Pulmonary effort is normal.     Breath sounds: No wheezing or rales.  Abdominal:     General: There is no distension.     Palpations: Abdomen is soft.     Tenderness: There is no abdominal tenderness.  Musculoskeletal:        General: No tenderness.     Cervical back: Normal range of motion and neck supple.  Skin:    General: Skin is warm and dry.  Neurological:     Mental Status: She is alert and oriented to person, place, and time.  Psychiatric:        Behavior: Behavior normal.    ED Results / Procedures / Treatments   Labs (all labs ordered are listed, but only abnormal results are  displayed) Labs Reviewed  CBC WITH DIFFERENTIAL/PLATELET - Abnormal; Notable for the following components:      Result Value   WBC 14.4 (*)    RBC 3.34 (*)    Hemoglobin 9.8 (*)    HCT 32.0 (*)    Neutro Abs 11.6 (*)    Monocytes Absolute 1.9 (*)    Abs Immature Granulocytes 0.12 (*)    All other components within normal limits  COMPREHENSIVE METABOLIC PANEL - Abnormal; Notable for the following components:   Glucose, Bld 69 (*)    BUN 34 (*)    Creatinine, Ser 1.01 (*)    Calcium 8.5 (*)    Total Protein 6.4 (*)    Total Bilirubin 0.1 (*)    All other components within normal limits  CBG MONITORING, ED - Abnormal; Notable for the following components:   Glucose-Capillary 171 (*)    All other components within normal limits  TROPONIN I (HIGH SENSITIVITY) - Abnormal; Notable for the following components:   Troponin I (High Sensitivity) 62 (*)    All other components within normal limits  RESP  PANEL BY RT-PCR (FLU A&B, COVID) ARPGX2  T4, FREE  TSH  URINALYSIS, ROUTINE W REFLEX MICROSCOPIC  TROPONIN I (HIGH SENSITIVITY)    EKG EKG Interpretation  Date/Time:  Friday May 03 2022 09:13:28 EDT Ventricular Rate:  109 PR Interval:    QRS Duration: 85 QT Interval:  347 QTC Calculation: 468 R Axis:   76 Text Interpretation: Atrial fibrillation Consider left ventricular hypertrophy Otherwise no significant change Confirmed by Melene PlanFloyd, Stefanny Pieri [phone]([id number]) on 05/03/2022 9:14:23 AM  Radiology DG Chest Port 1 View  Result Date: 05/03/2022 CLINICAL DATA:  Fatigue EXAM: PORTABLE CHEST 1 VIEW COMPARISON:  None Available. FINDINGS: The cardiomediastinal silhouette is within normal limits in size. There is a coronary stent noted. Aortic arch calcifications. There are mild diffuse interstitial opacities. There is no large pleural effusion. There is no visible pneumothorax. There is no acute osseous abnormality. Right shoulder osteoarthritis with humeral head remodeling and subchondral sclerosis. IMPRESSION: Diffuse interstitial opacities suggesting mild pulmonary edema or atypical infectious/inflammatory process. Severe right glenohumeral osteoarthritis with flattening of the humeral head and subchondral sclerosis. Underlying AVN is possible. Electronically Signed   By: Caprice RenshawJacob  Kahn M.D.   On: 05/03/2022 10:01    Procedures Procedures    Medications Ordered in ED Medications  diltiazem (CARDIZEM) 125 mg in dextrose 5% 125 mL (1 mg/mL) infusion (has no administration in time range)  diltiazem (CARDIZEM) injection 10 mg (has no administration in time range)  heparin ADULT infusion 100 units/mL (25000 units/25250mL) (has no administration in time range)  heparin bolus via infusion 2,000 Units (has no administration in time range)  sodium chloride 0.9 % bolus 1,000 mL (0 mLs Intravenous Stopped 05/03/22 1131)    ED Course/ Medical Decision Making/ A&P                           Medical Decision  Making Amount and/or Complexity of Data Reviewed Labs: ordered. Radiology: ordered. ECG/medicine tests: ordered.  Risk Prescription drug management. Decision regarding hospitalization.   71 yo F with chief complaints of feeling fatigued.  This started last night.  She has trouble describing it in any other way.  She was noted to be in A-fib with RVR with this new for her.  Rates mostly in the low 100s on initial exam.  We will give a bolus of IV fluids laboratory evaluation  and reassess.  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  Could be a withdrawal type syndrome.  Patient has a leukocytosis and a 2 g hemoglobin drop from earlier in the week.  Patient also has an AKI from labs done earlier this week.  Her troponin is also mildly elevated.  Chest x-ray independently interpreted by me without obvious focal infiltrate.  Radiology read with possible atypical pneumonia versus pulmonary edema.  Patient's rates continue to be in the 120s and 30s.  We will start on a diltiazem infusion.  Will discuss with medicine for admission.  CRITICAL CARE Performed by: Cecilio Asper   Total critical care time: 35 minutes  Critical care time was exclusive of separately billable procedures and treating other patients.  Critical care was necessary to treat or prevent imminent or life-threatening deterioration.  Critical care was time spent personally by me on the following activities: development of treatment plan with patient and/or surrogate as well as nursing, discussions with consultants, evaluation of patient's response to treatment, examination of patient, obtaining history from patient or surrogate, ordering and performing treatments and interventions, ordering and review of laboratory studies, ordering and review of radiographic studies, pulse oximetry and re-evaluation of patient's condition.  The  patients results and plan were reviewed and discussed.   Any x-rays performed were independently reviewed by myself.   Differential diagnosis were considered with the presenting HPI.  Medications  diltiazem (CARDIZEM) 125 mg in dextrose 5% 125 mL (1 mg/mL) infusion (has no administration in time range)  diltiazem (CARDIZEM) injection 10 mg (has no administration in time range)  heparin ADULT infusion 100 units/mL (25000 units/238mL) (has no administration in time range)  heparin bolus via infusion 2,000 Units (has no administration in time range)  sodium chloride 0.9 % bolus 1,000 mL (0 mLs Intravenous Stopped 05/03/22 1131)    Vitals:   05/03/22 1030 05/03/22 1100 05/03/22 1115 05/03/22 1145  BP: 101/71 114/80 106/71 122/78  Pulse: (!) 114 (!) 113 (!) 117 90  Resp: 16 10 10 11   Temp:      TempSrc:      SpO2: 96% 94% 94% 94%  Weight:      Height:        Final diagnoses:  Atrial fibrillation with rapid ventricular response ([place])    Admission/ observation were discussed with the admitting physician, patient and/or family and they are comfortable with the plan.          Final Clinical Impression(s) / ED Diagnoses Final diagnoses:  Atrial fibrillation with rapid ventricular response [facility])    Rx / DC Orders ED Discharge Orders     None         Deno Etienne, DO 05/03/22 1150

## 2022-05-03 NOTE — ED Triage Notes (Signed)
Pt BIB [facility] d/t generalized weakness. Per EMS, pt started experiencing it since midnight. Pt reports dizziness with movement. EKG shows AFIB w EMS. Pt has no history of AFIB. A&Ox4. Cbg 225. No hx of diabetes.   NS given by EMS.   HR 120  BP 106/58  100% ra

## 2022-05-04 ENCOUNTER — Observation Stay (HOSPITAL_COMMUNITY): Payer: Medicare Other

## 2022-05-04 LAB — BASIC METABOLIC PANEL
Anion gap: 8 (ref 5–15)
BUN: 33 mg/dL — ABNORMAL HIGH (ref 8–23)
CO2: 22 mmol/L (ref 22–32)
Calcium: 8.4 mg/dL — ABNORMAL LOW (ref 8.9–10.3)
Chloride: 103 mmol/L (ref 98–111)
Creatinine, Ser: 0.59 mg/dL (ref 0.44–1.00)
GFR, Estimated: >60 mL/min (ref >60)
Glucose, Bld: 103 mg/dL — ABNORMAL HIGH (ref 70–99)
Potassium: 4 mmol/L (ref 3.5–5.1)
Sodium: 133 mmol/L — ABNORMAL LOW (ref 135–145)

## 2022-05-04 LAB — CBC WITH DIFFERENTIAL/PLATELET
Abs Immature Granulocytes: 0.03 10*3/uL (ref 0.00–0.07)
Basophils Absolute: 0 10*3/uL (ref 0.0–0.1)
Basophils Relative: 0 %
Eosinophils Absolute: 0.1 10*3/uL (ref 0.0–0.5)
Eosinophils Relative: 1 %
HCT: 25.7 % — ABNORMAL LOW (ref 36.0–46.0)
Hemoglobin: 8 g/dL — ABNORMAL LOW (ref 12.0–15.0)
Immature Granulocytes: 0 %
Lymphocytes Relative: 28 %
Lymphs Abs: 2.6 10*3/uL (ref 0.7–4.0)
MCH: 28.9 pg (ref 26.0–34.0)
MCHC: 31.1 g/dL (ref 30.0–36.0)
MCV: 92.8 fL (ref 80.0–100.0)
Monocytes Absolute: 0.8 10*3/uL (ref 0.1–1.0)
Monocytes Relative: 8 %
Neutro Abs: 5.7 10*3/uL (ref 1.7–7.7)
Neutrophils Relative %: 63 %
Platelets: 210 10*3/uL (ref 150–400)
RBC: 2.77 MIL/uL — ABNORMAL LOW (ref 3.87–5.11)
RDW: 13.1 % (ref 11.5–15.5)
WBC: 9.3 10*3/uL (ref 4.0–10.5)
nRBC: 0 % (ref 0.0–0.2)

## 2022-05-04 LAB — HEPARIN LEVEL (UNFRACTIONATED)
Heparin Unfractionated: 0.18 IU/mL — ABNORMAL LOW (ref 0.30–0.70)
Heparin Unfractionated: 0.36 IU/mL (ref 0.30–0.70)

## 2022-05-04 LAB — TROPONIN I (HIGH SENSITIVITY)
Troponin I (High Sensitivity): 341 ng/L (ref <18)
Troponin I (High Sensitivity): 294 ng/L (ref <18)

## 2022-05-04 LAB — MAGNESIUM: Magnesium: 2.2 mg/dL (ref 1.7–2.4)

## 2022-05-04 LAB — HEPATITIS B SURFACE ANTIBODY, QUANTITATIVE: Hep B S AB Quant (Post): 18.7 m[IU]/mL (ref >9.9)

## 2022-05-04 MED ORDER — IOHEXOL 300 MG/ML  SOLN
100.0000 mL | Freq: Once | INTRAMUSCULAR | Status: AC | PRN
  Administered 2022-05-04: 100 mL via INTRAVENOUS

## 2022-05-04 MED ORDER — POLYETHYLENE GLYCOL 3350 17 G PO PACK
17.0000 g | PACK | Freq: Every day | ORAL | 0 refills | Status: DC | PRN
Start: 2022-05-04 — End: 2024-01-12

## 2022-05-04 MED ORDER — SODIUM CHLORIDE 0.9 % IV SOLN
250.0000 mg | Freq: Every day | INTRAVENOUS | Status: DC
  Administered 2022-05-04: 250 mg via INTRAVENOUS
  Filled 2022-05-04: qty 20

## 2022-05-04 MED ORDER — IRON (FERROUS SULFATE) 325 (65 FE) MG PO TABS: 1.0000 | ORAL_TABLET | Freq: Every day | ORAL | 1 refills | Status: DC

## 2022-05-04 MED ORDER — VITAMIN B-12 100 MCG PO TABS
500.0000 ug | ORAL_TABLET | Freq: Every day | ORAL | Status: DC
  Administered 2022-05-04: 500 ug via ORAL
  Filled 2022-05-04: qty 5

## 2022-05-04 MED ORDER — VITAMIN B-12 1000 MCG PO TABS: 1000.0000 ug | ORAL_TABLET | Freq: Every day | ORAL | 0 refills | Status: AC

## 2022-05-04 MED ORDER — ASPIRIN 81 MG PO TBEC: 81.0000 mg | DELAYED_RELEASE_TABLET | Freq: Every day | ORAL | 12 refills | Status: DC

## 2022-05-04 MED ORDER — ADULT MULTIVITAMIN W/MINERALS CH
1.0000 | ORAL_TABLET | Freq: Every day | ORAL | 0 refills | Status: DC
Start: 2022-05-04 — End: 2023-10-29

## 2022-05-04 MED ORDER — ATORVASTATIN CALCIUM 80 MG PO TABS: 80.0000 mg | ORAL_TABLET | Freq: Every day | ORAL | 1 refills | Status: DC

## 2022-05-04 NOTE — Discharge Summary (Addendum)
Name: Shannon Burgess MRN: [id number] DOB: Sep 20, 1951 71 y.o. PCP: Pcp, No  Date of Admission: 05/03/2022  9:02 AM Date of Discharge: No discharge date for patient encounter. Attending Physician: Shannon Riches, MD  Discharge Diagnosis: 1. Atrial Fibrillation with rapid ventricular response 2. NSTEMI, Type 2 3. Acute Kidney Injury  4. Normocytic anemia 5. Cachexia  6. Polysubstance use disorder   Discharge Medications: Allergies as of 05/04/2022   No Known Allergies      Medication List     TAKE these medications    aspirin EC 81 MG tablet Take 1 tablet (81 mg total) by mouth daily. Swallow whole. Start taking on: May 05, 2022   atorvastatin 80 MG tablet Commonly known as: LIPITOR Take 1 tablet (80 mg total) by mouth at bedtime.   DULoxetine 60 MG capsule Commonly known as: CYMBALTA Take 1 capsule (60 mg total) by mouth daily.   hydrOXYzine 25 MG tablet Commonly known as: ATARAX Take 1 tablet (25 mg total) by mouth 3 (three) times daily as needed for anxiety.   Iron (Ferrous Sulfate) 325 (65 Fe) MG Tabs Take 1 tablet by mouth daily.   multivitamin with minerals Tabs tablet Take 1 tablet by mouth daily.   nicotine 21 mg/24hr patch Commonly known as: NICODERM CQ - dosed in mg/24 hours Place 1 patch (21 mg total) onto the skin daily.   polyethylene glycol 17 g packet Commonly known as: MIRALAX / GLYCOLAX Take 17 g by mouth daily as needed for mild constipation.   risperiDONE 2 MG tablet Commonly known as: RISPERDAL Take 1 tablet (2 mg total) by mouth 2 (two) times daily.   traZODone 100 MG tablet Commonly known as: DESYREL Take 1 tablet (100 mg total) by mouth at bedtime as needed for sleep.   vitamin B-12 1000 MCG tablet Commonly known as: CYANOCOBALAMIN Take 1 tablet (1,000 mcg total) by mouth daily for 30 doses.        Disposition and follow-up:   Shannon Burgess was discharged from [facility] in Stable condition.  At the  hospital follow up visit please address:  1.  Polysubstance use, cervical cancer surveillance, colonoscopy  2.  Labs / imaging needed at time of follow-up: CBC at follow up and CBC + Iron panel in 2-3 months, MRI of R shoulder   3.  Pending labs/ test needing follow-up: CT chest   Follow-up Appointments:   Hospital Course by problem list: 1.  Atrial fibrillation with rapid ventricular response Patient has a history of coronary artery disease status post PCI with bare-metal stent x2 in 2012.  On admission, patient reported some dyspnea that resolved by the time of discharge.  In addition she noted that she could feel her heartbeat.  She was noted to be in atrial fibrillation with RVR. Patient's troponins peaked at 400 and this was thought to be due to demand ischemia in the setting of her atrial fibrillation.  A transthoracic echocardiogram was obtained which showed no evidence of regional wall motion abnormality and a normal EF.  Given the preceding this made ischemia an unlikely precipitant of patient's arrhythmia.  Patient did report cocaine and amphetamine use and her urine drug screen on admission confirmed this.  This is the most likely cause of her atrial fibrillation. Her TSH was also within normal limits.   Patient spontaneously converted in the ED.  Patient has a CHA2DS2-VASc score of 4 and was started on heparin infusion.  However, cardiology was consulted and  noted that given her polysubstance use she is at high risk of bleeding.  Discussed with the patient the importance of amphetamine and cocaine cessation.  Patient states that she will follow-up with the [facility] and at that time can discuss naltrexone-bupropion and other strategies to help with her substance use.  -Will hold anticoagulation at this time, will plan to revisit anticoagulation once patient's substance use is under better control.  -Patient spontaneously converted to normal sinus rhythm and remained in normal sinus rhythm  without AV nodal blocking agent will hold on sending patient out on beta blockade (cocaine use) or centrally acting CCB.  -Patient will need follow up with atrial fibrillation clinic. This can be set up in the outpatient setting. She may need 30 day device to determine her atrial fibrillation burden.   2. Normocytic anemia  Patient noted to have normocytic anemia on presentation. Her hemoglobin was 11.9 6 days prior to this admission.  On admission it was 9.8 and then 8 on the day of discharge.  Patient denied any hematemesis, melena, hematochezia, or hematuria.  She was hemodynamically stable.  On labs she had no evidence of hemolysis as her AST was within normal limits and her total bilirubin was also within normal limits.  Patient was noted to have a ferritin of 11 and a TSAT of 13%.  Her vitamin B12 was also on the lower end of normal at 188 pg/ML.   Low suspicion for acute blood loss anemia or hemolysis. Patient most likely has an anemia due to decreased production of erythrocytes in the setting of low iron and B12. Patient appears cachectic and is likely malnourished. In addition, her hemoglobin is likely lower in the setting of IV fluids.  -Given patient is stable and there are no active signs of bleeding will replete patients iron and vitamin B12. Plan to recheck her hemoglobin in 1-2 weeks at her follow up appointment with [facility]   3.  Acute kidney injury Patient had AKI.  This resolved with 1 L of normal saline.  Her renal ultrasound was unremarkable.  4. Multi-focal interstitial opacities  Patient's chest x-ray on admission was noted to have evidence of multifocal opacities.  Patient did note some dyspnea on exertion on admission but stated that this had resolved by the time of discharge.  She had no fever and her leukocytosis resolved without intervention.  Her CT chest showed evidence of pleural-parenchymal scarring.  Patient does have history of cocaine and methamphetamine use and is likely  the etiology of her radiographical findings.  Given patient is minimally symptomatic at this time will continue to encourage patient to refrain from cocaine and amphetamine use. Will also continue to monitor for return of her dyspnea when she sees [place] in clinic.   5.Cachexia Patient has a history of cervical cancer. She reports ongoing weight loss over the last year. Per chart everywhere she was around 62kg in 2017 and is now 47kg. Her CT chest was without evidence of malignancy. Her HIV was negative. She has no irregularities of her bowel movements including now decreased stool caliber or melena. Patient's heavy substance use (every other day amphetamine use) is likely the reason for her cachexia. However, patient will need further cancer screening set up for her in the outpatient setting as well as cervical cancer surveillance.   6. Previous Hep B infection Patient's hepatitis serologies notable for +Hep B core total antibody, -Hep Sag, +Hep B surface antibody.   7. CAD s/p BMS  Coronary Catherization/Interventions:  Catheterization-Left COMMENTS: LAD-80% mid LCx-non-obstructive RCA: 90% proximal Normal LVF PCI of RCA and LAD RCA: 2.55mm balloon; 3.5/23mm Vision BMS; there was residual : post-dilated with 3.52mm non-compliant balloon; 22 atmosphere: 90% to 10% LAD: 2.25mm balloon: 3.5/18mm Integretity BMS: 80% to Angiomax used; ASA/Prasugrel Coronary Stent Placement Bare Metal Stent stent to LAD times 1 Bare Metal Stent stent to RCA times 1  TTE with normal EF and no RWMA. Patient was noted to have elevated RVSP, PASP, and decreased variability with inspiration of her IVC. Given her CT chest showed likely pleuorparenchymal scarring this is most likely related to her lung disease. Patient encouraged to stop amphetamine and cocaine use to hopefully prevent progression of her lung disease. May need pulmonology consult in the future if she becomes symptomatic, though treatment modalities are  likely limited given he underlying lung pathology.  -Continue aspirin on discharge   8. Polysubstance use Frequent methamphetamine and cocaine use. As well as marijuana. Consider starting naltrexone-buproprion in the outpatient setting. TOC was consulted this hospitalization and gave patient resources as well.   Discharge Exam:   BP 121/88 (BP Location: Left Arm)   Pulse 82   Temp 97.6 F (36.4 C) (Oral)   Resp 15   Ht 5\' 3"  (1.6 m)   Wt 47 kg   SpO2 94%   BMI 18.35 kg/m  Discharge exam:   Constitutional: thin, mal-nourished, and in no distress.  HENT:  Head: Normocephalic and atraumatic.  Eyes: EOM are normal.  Neck: Normal range of motion.  Cardiovascular: Normal rate, regular rhythm, intact distal pulses. No gallop and no friction rub.  No murmur heard. No lower extremity edema  Pulmonary: Non labored breathing on room air, no wheezing or rales  Abdominal: Soft. Normal bowel sounds. Non distended and non tender Musculoskeletal: Normal range of motion.        General: No tenderness or edema.  Neurological: Alert and oriented to person, place, and time. Non focal. Skin: Skin is warm and dry   Pertinent Labs, Studies, and Procedures:  TTE  IMPRESSIONS     1. Left ventricular ejection fraction, by estimation, is 60 to 65%. The  left ventricle has normal function. The left ventricle has no regional  wall motion abnormalities. There is moderate left ventricular hypertrophy.  Left ventricular diastolic  parameters are consistent with Grade I diastolic dysfunction (impaired  relaxation).   2. Right ventricular systolic function is normal. The right ventricular  size is normal. There is moderately elevated pulmonary artery systolic  pressure. The estimated right ventricular systolic pressure is 45.5 mmHg.   3. The mitral valve is normal in structure. Mild to moderate mitral valve  regurgitation. No evidence of mitral stenosis.   4. Tricuspid valve regurgitation is moderate  to severe.   5. The aortic valve is tricuspid. There is mild thickening of the aortic  valve. Aortic valve regurgitation is not visualized. No aortic stenosis is  present.   6. The inferior vena cava is dilated in size with <50% respiratory  variability, suggesting right atrial pressure of 15 mmHg.  Component Ref Range & Units 1 d ago  Vitamin B-12 180 - 914 pg/mL 188    Component Ref Range & Units 1 d ago  Ferritin 11 - 307 ng/mL 11    Component Ref Range & Units 1 d ago  Iron 28 - 170 ug/dL 67   TIBC 802 - 233 ug/dL 612 High    Saturation Ratios 10.4 - 31.8 % 13  UIBC ug/dL 465    Component Ref Range & Units 1 d ago  Folate >5.9 ng/mL >40.0    Component Ref Range & Units 03:39 (05/04/22) 01:20 (05/04/22) 1 d ago (05/03/22) 1 d ago (05/03/22) 1 d ago (05/03/22) 1 d ago (05/03/22) 1 d ago (05/03/22)  Troponin I (High Sensitivity) <18 ng/L 294 High Panic   341 High Panic  CM  400 High Panic  CM  399 High Panic  CM  397 High Panic  CM  368 High Panic  CM  155 High Panic  CM   Component Ref Range & Units 1 d ago  HIV Screen 4th Generation wRfx Non Reactive Non Reactive       Latest Ref Rng & Units 05/04/2022    1:20 AM 05/03/2022    9:45 AM 04/27/2022   10:29 PM  CBC  WBC 4.0 - 10.5 K/uL 9.3   14.4   10.0    Hemoglobin 12.0 - 15.0 g/dL 8.0   9.8   11.9    Hematocrit 36.0 - 46.0 % 25.7   32.0   35.2    Platelets 150 - 400 K/uL 210   244   394        Latest Ref Rng & Units 05/04/2022    1:20 AM 05/03/2022    9:45 AM 04/27/2022   10:29 PM  BMP  Glucose 70 - 99 mg/dL 103   69   88    BUN 8 - 23 mg/dL 33   34   23    Creatinine 0.44 - 1.00 mg/dL 0.59   1.01   0.63    Sodium 135 - 145 mmol/L 133   139   139    Potassium 3.5 - 5.1 mmol/L 4.0   4.5   3.9    Chloride 98 - 111 mmol/L 103   105   107    CO2 22 - 32 mmol/L 22   24   25     Calcium 8.9 - 10.3 mg/dL 8.4   8.5   9.2     CT chest 5/27 IMPRESSION: 1. No signs of pleural effusion or airspace consolidation. 2.  Pleuroparenchymal scarring versus sub pleural atelectasis noted within both lung bases, left greater than right. 3. Age-indeterminate compression fractures at T7 and T9 with loss of greater than 50% of the vertebral body heights. 4. Perifissural nodule along the minor fissure measures 5 mm. No routine follow-up imaging is recommended per Fleischner Society Guidelines. 5. Aortic Atherosclerosis (ICD10-I70.0). 6. Coronary artery calcifications   Signed: Rick Duff, MD 05/04/2022, 11:54 AM

## 2022-05-04 NOTE — Evaluation (Signed)
Occupational Therapy Evaluation Patient Details Name: Shannon Burgess MRN: [id number] DOB: 15-Jun-1951 Today's Date: 05/04/2022   History of Present Illness The pt is a 71 yo female presenting 5/26 with generalized weakness and dizziness. Pt found to be in afib with RVR which has spontaneously converted back to sinus rhythm. UDS positive for opiates and cocaine. PMH includes: polysubstance abuse (tobacco, cocaine, methamphetamine, heroin), CAD, HTN, HLD, depression, and anxiety with panic attacks.   Clinical Impression   PTA, pt reports she lives at home with her son and roommate. Pt reports she was independent with ADL/IADL but her son assisted with cooking. Pt currently demonstrates ability to complete ADL and functional mobility at independent level. Pt reports she is at her baseline. VSS throughout session. Patient evaluated by Occupational Therapy with no further acute OT needs identified. All education has been completed and the patient has no further questions. See below for any follow-up Occupational Therapy or equipment needs. OT to sign off. Thank you for referral.        Recommendations for follow up therapy are one component of a multi-disciplinary discharge planning process, led by the attending physician.  Recommendations may be updated based on patient status, additional functional criteria and insurance authorization.   Follow Up Recommendations  No OT follow up    Assistance Recommended at Discharge None  Patient can return home with the following Assistance with cooking/housework    Functional Status Assessment  Patient has not had a recent decline in their functional status  Equipment Recommendations       Recommendations for Other Services       Precautions / Restrictions Precautions Precautions: Fall Restrictions Weight Bearing Restrictions: No      Mobility Bed Mobility Overal bed mobility: Independent             General bed mobility comments: no  assist or increased time    Transfers Overall transfer level: Independent                        Balance Overall balance assessment: Mild deficits observed, not formally tested                                         ADL either performed or assessed with clinical judgement   ADL Overall ADL's : Independent                                             Vision         Perception     Praxis      Pertinent Vitals/Pain Pain Assessment Pain Assessment: No/denies pain     Hand Dominance Left   Extremity/Trunk Assessment Upper Extremity Assessment Upper Extremity Assessment: Overall WFL for tasks assessed   Lower Extremity Assessment Lower Extremity Assessment: Overall WFL for tasks assessed   Cervical / Trunk Assessment Cervical / Trunk Assessment: Normal   Communication Communication Communication: No difficulties   Cognition Arousal/Alertness: Awake/alert Behavior During Therapy: WFL for tasks assessed/performed Overall Cognitive Status: Within Functional Limits for tasks assessed  General Comments  vss on RA    Exercises     Shoulder Instructions      Home Living Family/patient expects to be discharged to:: Private residence Living Arrangements: Other relatives;Non-relatives/Friends Available Help at Discharge: Family;Friend(s);Available 24 hours/day Type of Home: House Home Access: Level entry     Home Layout: One level     Bathroom Shower/Tub: Chief Strategy Officer: Standard     Home Equipment: Cane - single point;Shower seat          Prior Functioning/Environment Prior Level of Function : Independent/Modified Independent             Mobility Comments: pt reports independent with use of cane as needed. ADLs Comments: pt reports indepedence with ADLs, reports she can clean but does not enjoy cooking, happily retired. her son  does the cooking        OT Problem List:        OT Treatment/Interventions:      OT Goals(Current goals can be found in the care plan section) Acute Rehab OT Goals Patient Stated Goal: to go home today OT Goal Formulation: With patient Time For Goal Achievement: 05/18/22 Potential to Achieve Goals: Good  OT Frequency:      Co-evaluation              AM-PAC OT "6 Clicks" Daily Activity     Outcome Measure Help from another person eating meals?: None Help from another person taking care of personal grooming?: None Help from another person toileting, which includes using toliet, bedpan, or urinal?: None Help from another person bathing (including washing, rinsing, drying)?: None Help from another person to put on and taking off regular upper body clothing?: None Help from another person to put on and taking off regular lower body clothing?: None 6 Click Score: 24   End of Session Nurse Communication: Mobility status  Activity Tolerance: Patient tolerated treatment well Patient left: in chair;with call bell/phone within reach  OT Visit Diagnosis: Other abnormalities of gait and mobility (R26.89)                Time: 8250-5397 OT Time Calculation (min): 10 min Charges:  OT General Charges $OT Visit: 1 Visit OT Evaluation $OT Eval Low Complexity: 1 Low  Hortense Cantrall OTR/L Acute Rehabilitation Services Office: [phone]   Rebeca Alert 05/04/2022, 12:11 PM

## 2022-05-04 NOTE — Progress Notes (Signed)
ANTICOAGULATION CONSULT NOTE - Follow Up Consult  Pharmacy Consult for heparin Indication: atrial fibrillation  Labs: Recent Labs    05/03/22 0945 05/03/22 1236 05/03/22 1836 05/03/22 2006 05/03/22 2245  HGB 9.8*  --   --   --   --   HCT 32.0*  --   --   --   --   PLT 244  --   --   --   --   HEPARINUNFRC  --   --   --   --  <0.10*  CREATININE 1.01*  --   --   --   --   TROPONINIHS 62*   < > 397* 399* 400*   < > = values in this interval not displayed.    Assessment: 71yo female subtherapeutic on heparin with initial dosing for Afib; no infusion issues or signs of bleeding per RN.  Goal of Therapy:  Heparin level 0.3-0.7 units/ml   Plan:  Will increase heparin infusion by 3 units/kg/hr to 700 units/hr and check level in 8 hours.    Wynona Neat, PharmD, BCPS  05/04/2022,12:53 AM

## 2022-05-04 NOTE — Discharge Instructions (Addendum)
You will need close follow up with [facility] clinic, please call [phone] to schedule an appointment with [place]. We will also continue to work to get you resources for your substance use disorder to help with your other health problems.   Please get into a support group.  [phone] ([phone]) Please use this number for more information on resources to help with your addiction.   Look forward to seeing you at our clinic!

## 2022-05-04 NOTE — Progress Notes (Signed)
ANTICOAGULATION CONSULT NOTE - Follow Up Consult  Pharmacy Consult for Heparin Indication: atrial fibrillation  No Known Allergies  Patient Measurements: Height: 5\' 3"  (160 cm) Weight: 47 kg (103 lb 9.9 oz) IBW/kg (Calculated) : 52.4 kg Heparin Dosing Weight: 38.6 kg  Vital Signs: Temp: 97.6 F (36.4 C) (05/27 0514) Temp Source: Oral (05/27 0514) BP: 121/88 (05/27 0514) Pulse Rate: 82 (05/27 0514)  Labs: Recent Labs    05/03/22 0945 05/03/22 1236 05/03/22 2245 05/04/22 0120 05/04/22 0339  HGB 9.8*  --   --  8.0*  --   HCT 32.0*  --   --  25.7*  --   PLT 244  --   --  210  --   HEPARINUNFRC  --   --  <0.10*  --   --   CREATININE 1.01*  --   --  0.59  --   TROPONINIHS 62*   < > 400* 341* 294*   < > = values in this interval not displayed.    Estimated Creatinine Clearance: 49.2 mL/min (by C-G formula based on SCr of 0.59 mg/dL).  Assessment: 71 yo female presented with generalized weakness and found to be in atrial fibrillation. The patient spontaneously converted back to sinus rhythm without medications. PTA the patient is not on anticoagulation. Pharmacy is consulted to dose heparin.  Heparin level is subtherapeutic at 0.18 while running at 700 units/hr. Per the RN, there have been no issues with the infusion and the patient is without signs or symptoms of bleeding. Hgb 8.0, platelets 210.  Goal of Therapy:  Heparin level 0.3-0.7 units/ml Monitor platelets by anticoagulation protocol: Yes   Plan:  Increase heparin IV to 850 units/hr Check an 8-hour heparin level Monitor for signs and symptoms of bleeding Obtain a daily heparin level and CBC Follow plans for long-term anticoagulation  Shauna Hugh, PharmD, [place]  PGY-2 Pharmacy Resident 05/04/2022 7:15 AM  Please check [email or web address] for unit-specific pharmacy phone numbers.

## 2022-05-04 NOTE — Progress Notes (Signed)
Pt being d/c.  PIVs removed Discharge information given.  Pt verb understanding and had no further questions... waiting for son to pick pt up.

## 2022-05-04 NOTE — Evaluation (Signed)
Physical Therapy Evaluation Patient Details Name: Shannon Burgess MRN: [id number] DOB: December 17, 1950 Today's Date: 05/04/2022  History of Present Illness  The pt is a 71 yo female presenting 5/26 with generalized weakness and dizziness. Pt found to be in afib with RVR which has spontaneously converted back to sinus rhythm. UDS positive for opiates and cocaine. PMH includes: polysubstance abuse (tobacco, cocaine, methamphetamine, heroin), CAD, HTN, HLD, depression, and anxiety with panic attacks.   Clinical Impression  Pt in bed upon arrival of PT, agreeable to evaluation at this time. Prior to admission the pt was independent with intermittent use of SPC for community distances. The pt lives with her son and roommate, states she is independent with all ADLs and IADLs, but could have assist from them if needed after d/c. The pt was able to complete good bout of hallway ambulation without need for AD or assist, no overt LOB and improvement in gait speed and stability with continued mobility. She also scored 22/24 on DGI indicating low risk of falls. The pt reports she feels close to her mobility baseline other than reports of numbness and tingling in distal calves bilaterally, but states this improved slightly with mobility. Is safe to mobilize with RN staff until medically ready for d/c. No further acute PT needs, will sign off. Thank you for the consult.   Dynamic Gait Index (DGI): 22/24 (<19 indicates increased risk for falls)  Gait Speed: 0.85ms. (Gait speed < 1.016m indicates increased risk of falls0      Recommendations for follow up therapy are one component of a multi-disciplinary discharge planning process, led by the attending physician.  Recommendations may be updated based on patient status, additional functional criteria and insurance authorization.  Follow Up Recommendations No PT follow up    Assistance Recommended at Discharge PRN  Patient can return home with the following        Equipment Recommendations None recommended by PT  Recommendations for Other Services       Functional Status Assessment Patient has not had a recent decline in their functional status     Precautions / Restrictions Precautions Precautions: Fall Restrictions Weight Bearing Restrictions: No      Mobility  Bed Mobility Overal bed mobility: Independent             General bed mobility comments: no assist or increased time    Transfers Overall transfer level: Independent Equipment used: None               General transfer comment: no assist or increased time, able to complete x4 from recliner without use of UE    Ambulation/Gait Ambulation/Gait assistance: Supervision Gait Distance (Feet): 250 Feet Assistive device: None Gait Pattern/deviations: Step-through pattern, Decreased stride length Gait velocity: 0.45 m/s -> 0.71 m/s within session Gait velocity interpretation: 1.31 - 2.62 ft/sec, indicative of limited community ambulator   General Gait Details: pt intially with slowed, waddling gait but improved with continued mobility. able to complete balance challenge without LOB or need for UE support     Balance Overall balance assessment: Mild deficits observed, not formally tested                               Standardized Balance Assessment Standardized Balance Assessment : Dynamic Gait Index   Dynamic Gait Index Level Surface: Normal Change in Gait Speed: Normal Gait with Horizontal Head Turns: Normal Gait with Vertical Head Turns: Mild Impairment Gait and  Pivot Turn: Normal Step Over Obstacle: Normal Step Around Obstacles: Normal Steps: Mild Impairment Total Score: 22       Pertinent Vitals/Pain Pain Assessment Pain Assessment: No/denies pain    Home Living Family/patient expects to be discharged to:: Private residence Living Arrangements: Other relatives;Non-relatives/Friends Available Help at Discharge:  Family;Friend(s);Available 24 hours/day Type of Home: House Home Access: Level entry       Home Layout: One level Home Equipment: Cane - single point;Shower seat      Prior Function Prior Level of Function : Independent/Modified Independent             Mobility Comments: pt reports independent with use of cane as needed. ADLs Comments: pt reports indepedence with ADLs, reports she can clean but does not enjoy cooking, happily retired.     Hand Dominance   Dominant Hand: Left    Extremity/Trunk Assessment   Upper Extremity Assessment Upper Extremity Assessment: Overall WFL for tasks assessed    Lower Extremity Assessment Lower Extremity Assessment: Overall WFL for tasks assessed (pt reports bilateral numbness in distal legs, RLE improved with walking, LLE remains numb and tingling)    Cervical / Trunk Assessment Cervical / Trunk Assessment: Normal  Communication   Communication: No difficulties  Cognition Arousal/Alertness: Awake/alert Behavior During Therapy: WFL for tasks assessed/performed Overall Cognitive Status: Within Functional Limits for tasks assessed                                 General Comments: pt following all cues and instructions        General Comments General comments (skin integrity, edema, etc.): VSS on RA        Assessment/Plan    PT Assessment All further PT needs can be met in the next venue of care  PT Problem List Decreased activity tolerance           PT Goals (Current goals can be found in the Care Plan section)  Acute Rehab PT Goals Patient Stated Goal: return home today PT Goal Formulation: All assessment and education complete, DC therapy Time For Goal Achievement: 05/10/22 Potential to Achieve Goals: Good            AM-PAC PT "6 Clicks" Mobility  Outcome Measure Help needed turning from your back to your side while in a flat bed without using bedrails?: None Help needed moving from lying on  your back to sitting on the side of a flat bed without using bedrails?: None Help needed moving to and from a bed to a chair (including a wheelchair)?: None Help needed standing up from a chair using your arms (e.g., wheelchair or bedside chair)?: None Help needed to walk in hospital room?: A Little Help needed climbing 3-5 steps with a railing? : A Little 6 Click Score: 22    End of Session Equipment Utilized During Treatment: Gait belt Activity Tolerance: Patient tolerated treatment well Patient left: in chair;with call bell/phone within reach Nurse Communication: Mobility status PT Visit Diagnosis: Other abnormalities of gait and mobility (R26.89)    Time: 5625-6389 PT Time Calculation (min) (ACUTE ONLY): 24 min   Charges:   PT Evaluation $PT Eval Low Complexity: 1 Low PT Treatments $Therapeutic Exercise: 8-22 mins        [place] Carbo, PT, DPT   Acute Rehabilitation Department Pager #: [phone]  Sandra Cockayne 05/04/2022, 9:41 AM

## 2022-05-13 ENCOUNTER — Other Ambulatory Visit: Payer: Self-pay | Admitting: Student

## 2022-05-13 DIAGNOSIS — F329 Major depressive disorder, single episode, unspecified: Secondary | ICD-10-CM

## 2022-05-13 DIAGNOSIS — G4700 Insomnia, unspecified: Secondary | ICD-10-CM

## 2022-05-13 DIAGNOSIS — F419 Anxiety disorder, unspecified: Secondary | ICD-10-CM

## 2022-05-14 MED ORDER — DULOXETINE HCL 60 MG PO CPEP: 60.0000 mg | ORAL_CAPSULE | Freq: Every day | ORAL | 0 refills | Status: DC

## 2022-05-14 MED ORDER — HYDROXYZINE HCL 25 MG PO TABS: 25.0000 mg | ORAL_TABLET | Freq: Three times a day (TID) | ORAL | 0 refills | Status: DC | PRN

## 2022-05-14 MED ORDER — TRAZODONE HCL 100 MG PO TABS: 100.0000 mg | ORAL_TABLET | Freq: Every evening | ORAL | 0 refills | Status: DC | PRN

## 2022-05-14 MED ORDER — RISPERIDONE 2 MG PO TABS: 2.0000 mg | ORAL_TABLET | Freq: Two times a day (BID) | ORAL | 0 refills | Status: DC

## 2022-05-14 NOTE — Progress Notes (Signed)
Patient called 6E nurses, stated she did not have any of her psychiatric medications. Discussed with patient that she should see the Doctor who prescribed these medications initially. She states that she will make an appointment, but would like a 30 day supply until she can schedule an appointment. Gave patient a 30 d supply of cymbalta, trazodone, risperdal, and hydroxyzine.

## 2022-05-14 NOTE — Addendum Note (Signed)
Addended by: Althea Grimmer on: 05/14/2022 08:51 AM   Modules accepted: Orders

## 2022-06-10 ENCOUNTER — Other Ambulatory Visit: Payer: Self-pay | Admitting: Student

## 2022-06-10 DIAGNOSIS — F329 Major depressive disorder, single episode, unspecified: Secondary | ICD-10-CM

## 2022-06-10 DIAGNOSIS — G4700 Insomnia, unspecified: Secondary | ICD-10-CM

## 2022-06-10 NOTE — Telephone Encounter (Signed)
Not a Clinic patient.  Has no future appointments.  No PCP listed.

## 2023-03-21 ENCOUNTER — Telehealth: Payer: Medicare Other | Admitting: Physician Assistant

## 2023-03-21 NOTE — Progress Notes (Signed)
The patient no-showed for appointment despite this provider sending direct link, reaching out via phone with no response and waiting for at least 10 minutes from appointment time for patient to join. They will be marked as a NS for this appointment/time.   Elika Godar M Atley [place], PA-C    

## 2023-05-19 IMAGING — US US RENAL
1 series · 14 of 25 positions shown · non-contrast
Comparison: None Available.

CLINICAL DATA: Acute kidney injury

EXAM:
RENAL / URINARY TRACT ULTRASOUND COMPLETE

[Series 1: us renal · 14 of 41 slices shown]
[im 1/41]
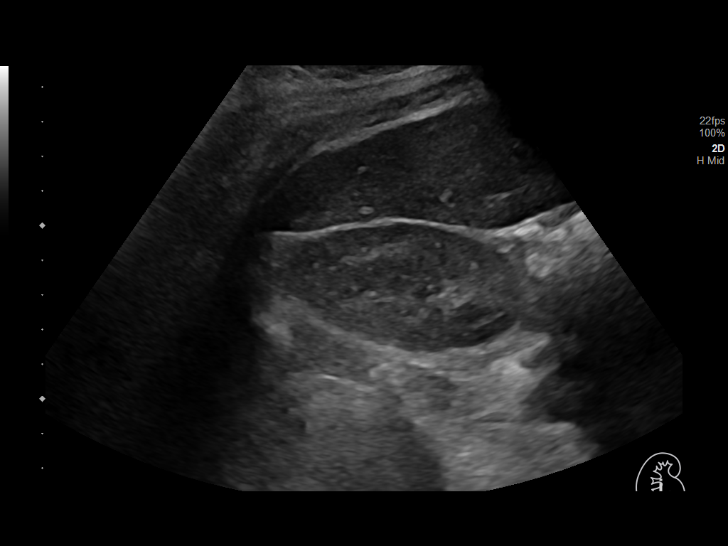
[im 4/41]
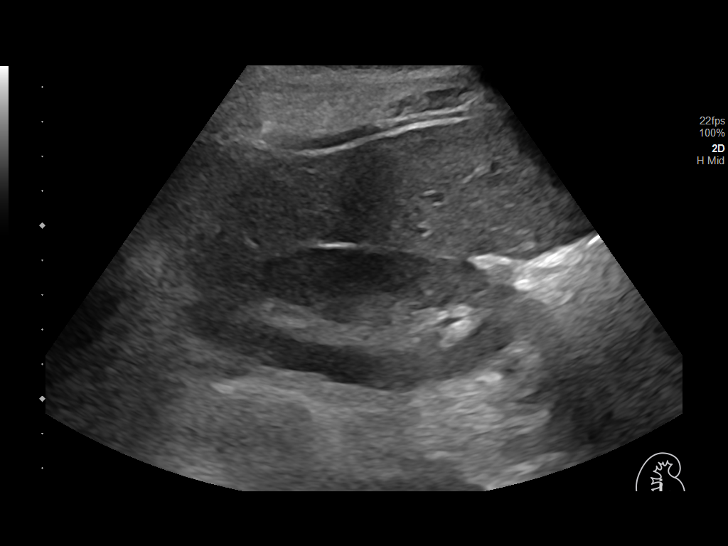
[im 7/41]
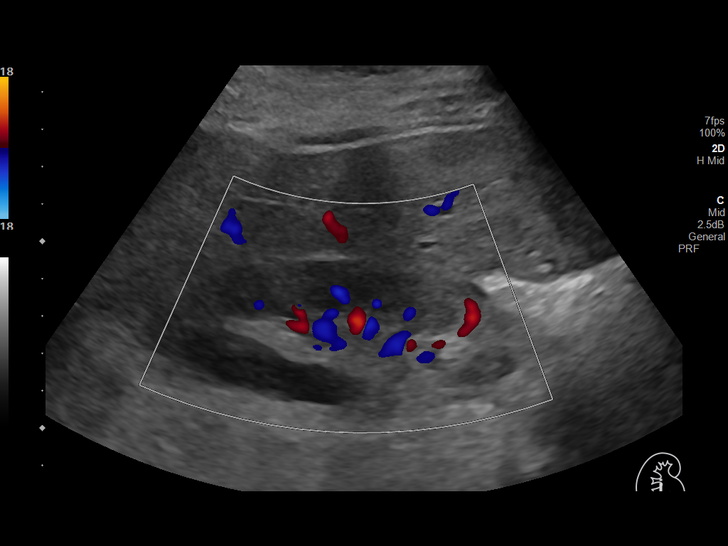
[im 11/41]
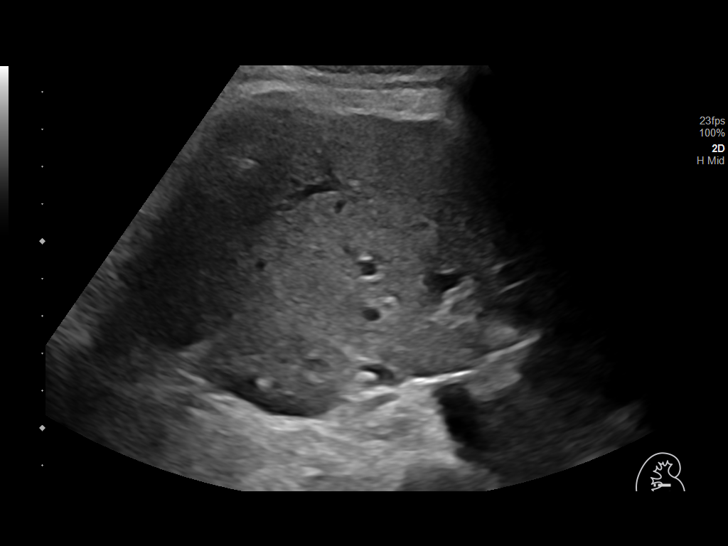
[im 14/41]
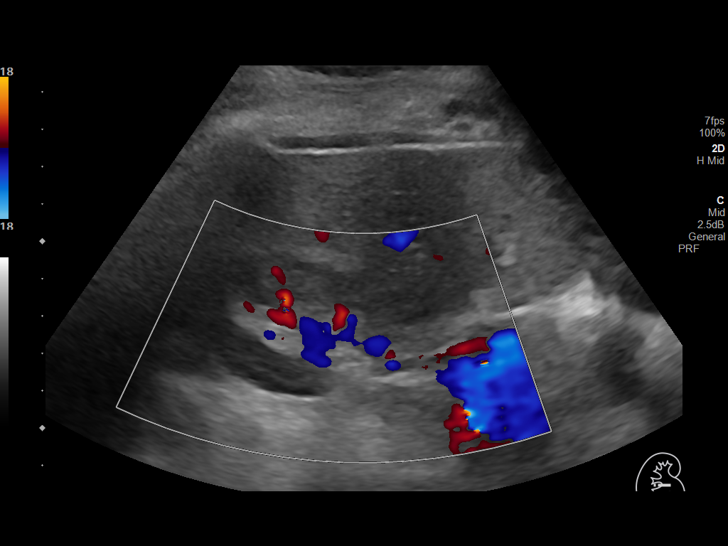
[im 16/41]
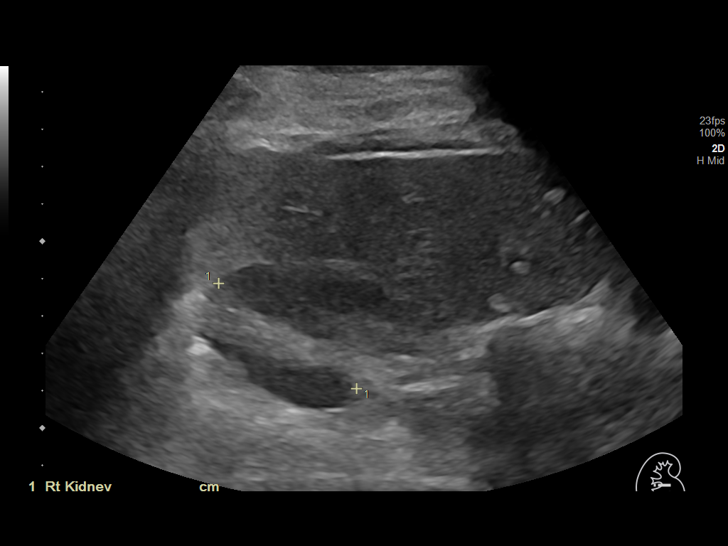
[im 19/41]
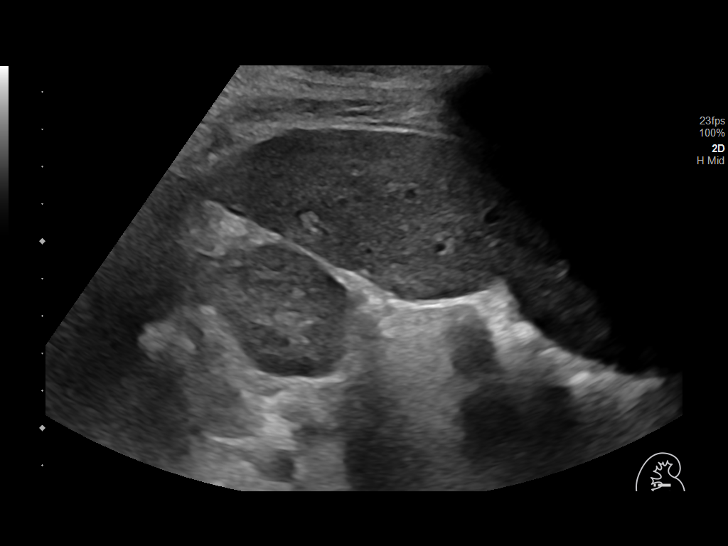
[im 22/41]
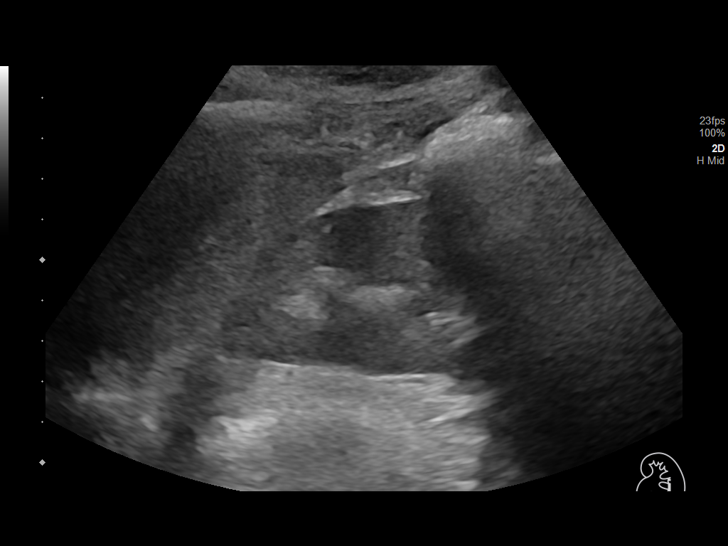
[im 26/41]
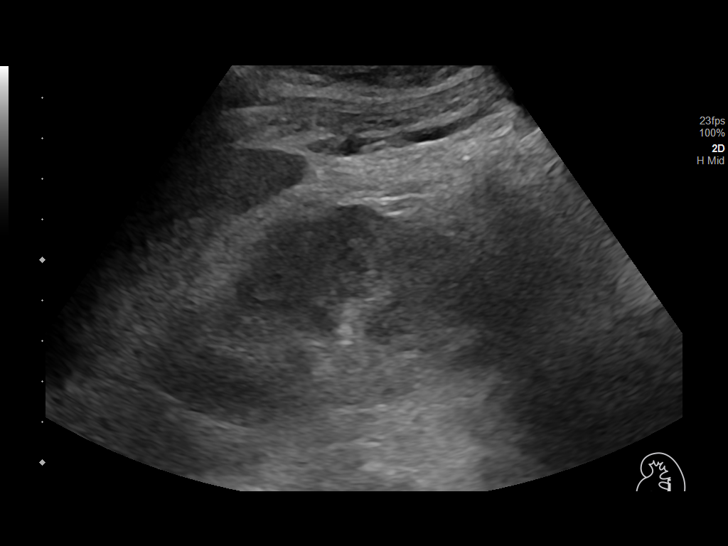
[im 27/41]
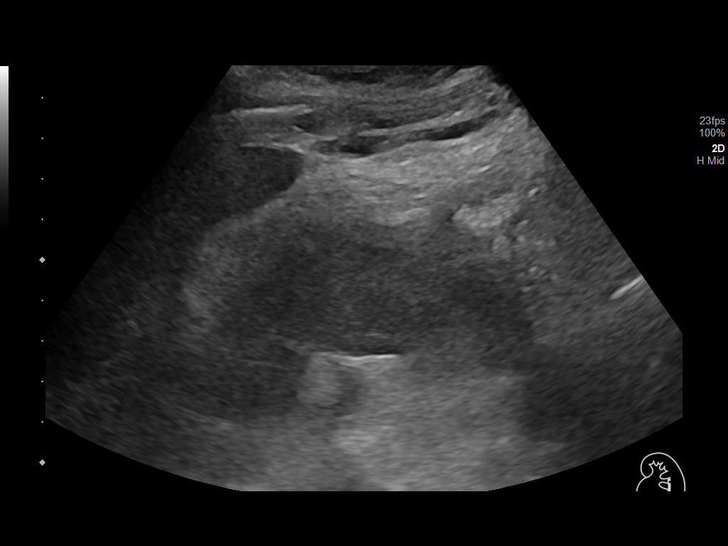
[im 31/41]
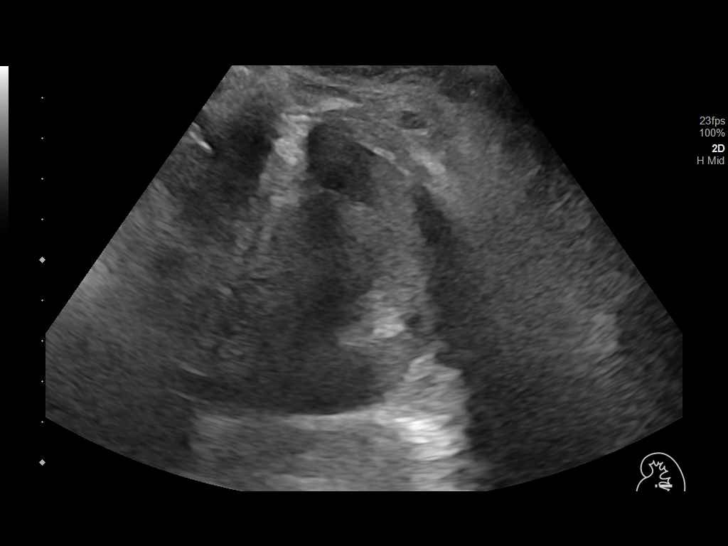
[im 34/41]
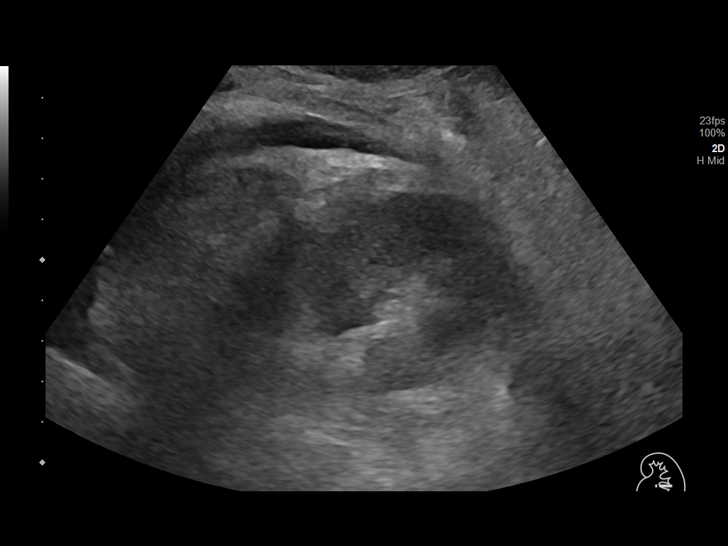
[im 37/41]
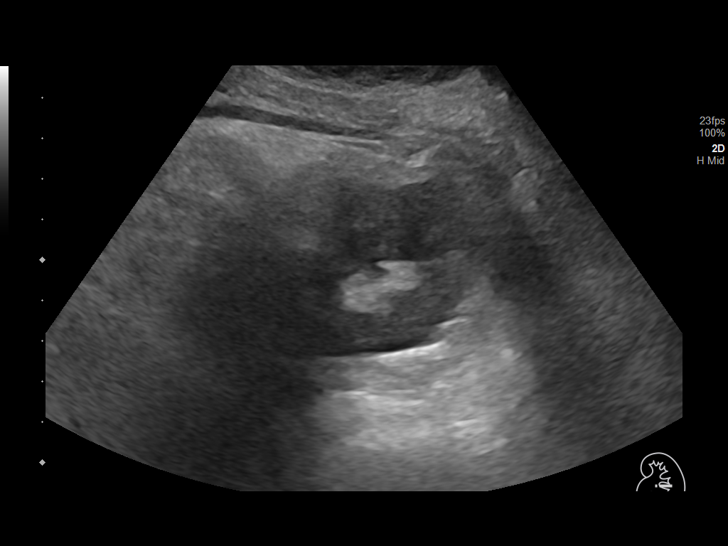
[im 41/41]
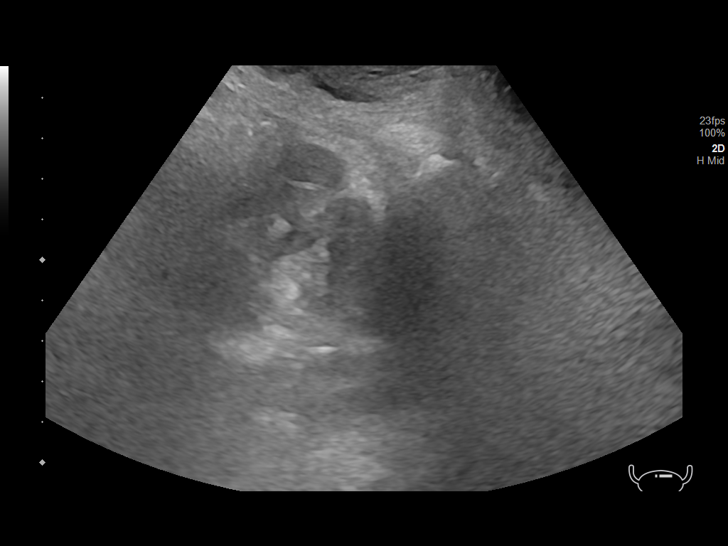

[14 of 25 positions shown; findings below may reference images not displayed]

FINDINGS: Right Kidney:

Renal measurements: 10.2 x 4.1 x 4.6 cm = volume: 100 mL.
Echogenicity within normal limits. No mass or hydronephrosis
visualized.

Left Kidney:

Renal measurements: 9.2 x 4.6 x 4.9 cm = volume: 107 mL.
Echogenicity within normal limits. No mass or hydronephrosis
visualized.

Bladder:

Decompressed, not visualized.

Other:

None.
IMPRESSION: No acute findings.  No hydronephrosis.

## 2023-05-19 IMAGING — DX DG CHEST 1V PORT
1 series · 1 of 1 positions shown · non-contrast
Comparison: None Available.

CLINICAL DATA: Fatigue

EXAM:
PORTABLE CHEST 1 VIEW

[chest]
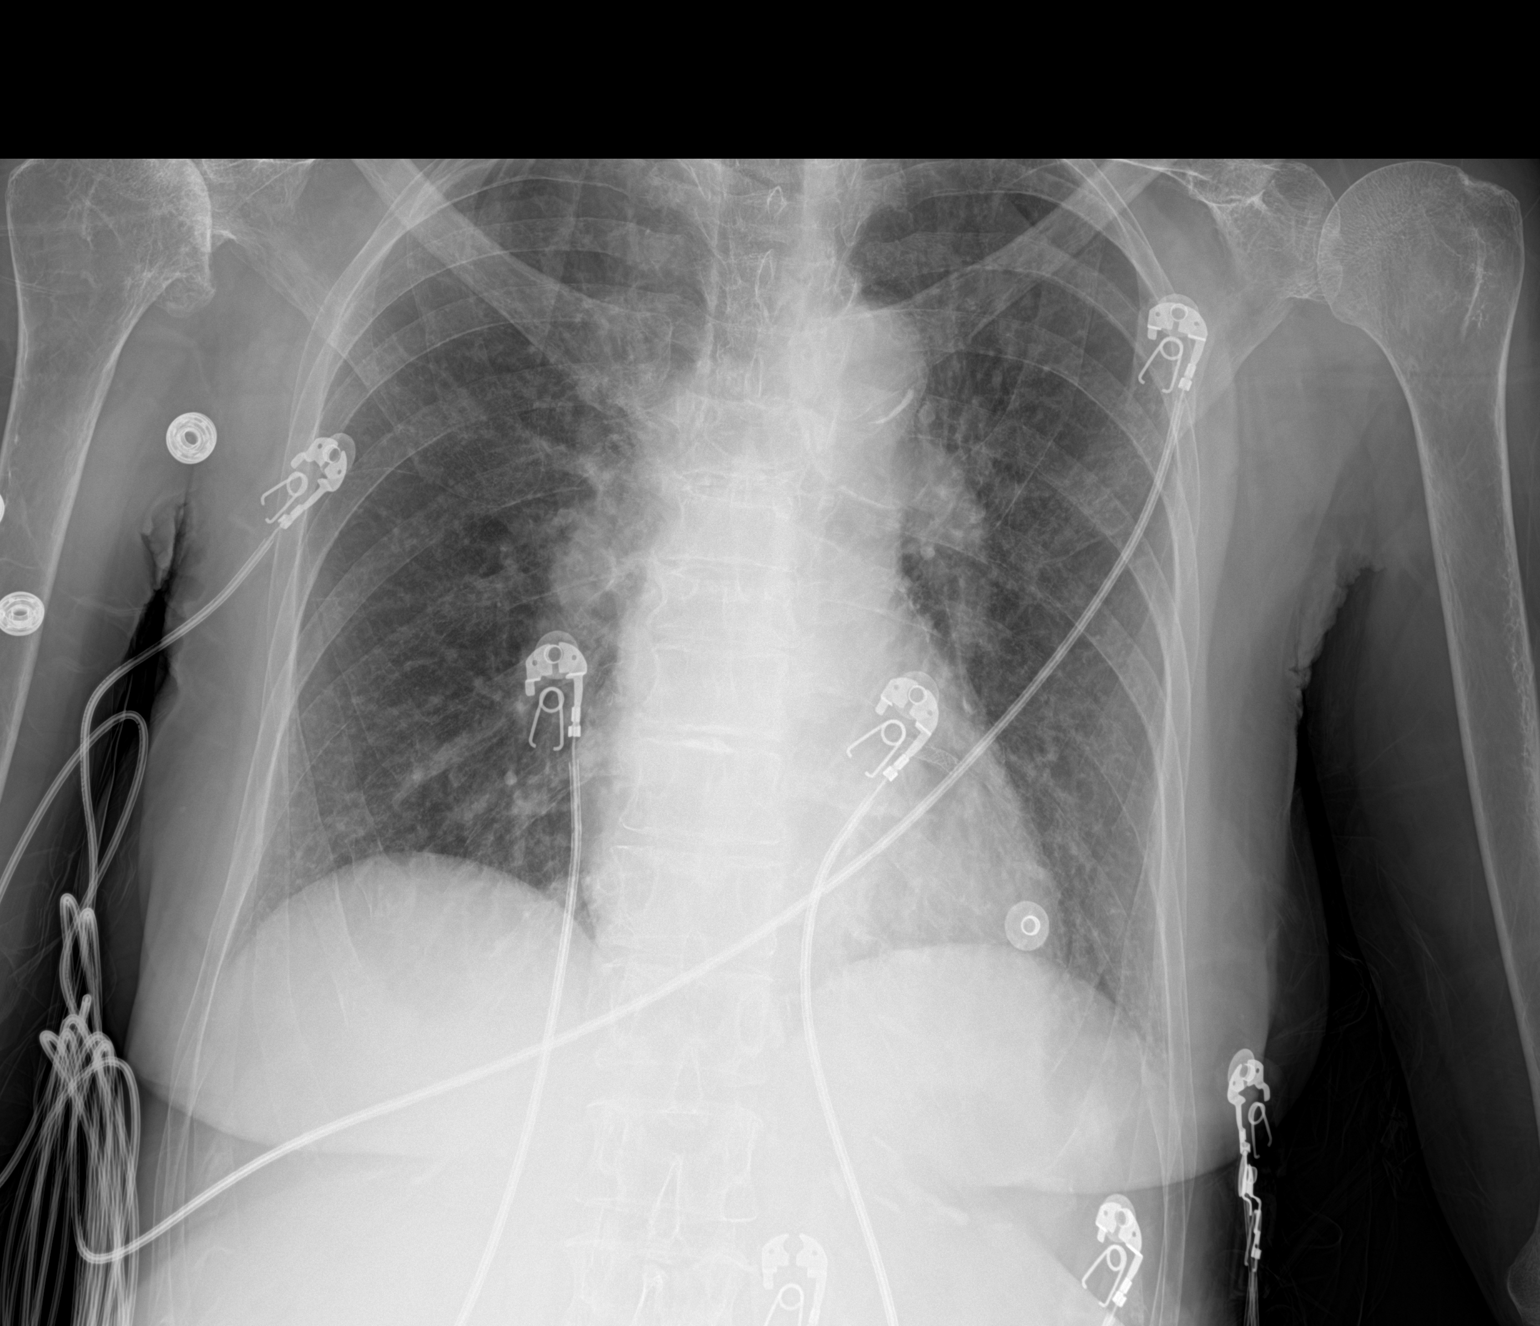

[1 of 1 positions shown; findings below may reference images not displayed]

FINDINGS: The cardiomediastinal silhouette is within normal limits in size.
There is a coronary stent noted. Aortic arch calcifications. There
are mild diffuse interstitial opacities. There is no large pleural
effusion. There is no visible pneumothorax. There is no acute
osseous abnormality. Right shoulder osteoarthritis with humeral head
remodeling and subchondral sclerosis.
IMPRESSION: Diffuse interstitial opacities suggesting mild pulmonary edema or
atypical infectious/inflammatory process.

Severe right glenohumeral osteoarthritis with flattening of the
humeral head and subchondral sclerosis. Underlying AVN is possible.

## 2023-05-20 IMAGING — CT CT CHEST W/ CM
2 of 3 series · 15 of 36 positions shown, 18 images · IV contrast (Omni 300)
Comparison: Chest radiograph 05/03/2022

CLINICAL DATA: Respiratory illness.

EXAM:
CT CHEST WITH CONTRAST
TECHNIQUE: Multidetector CT imaging of the chest was performed during
intravenous contrast administration.

[Series 4: chest with 2mm st · axial · 0.59mm/px · z∈[+1222,+1494]mm · 12 of 160 slices shown, 15 images]
[im 12/160  mediastinal]
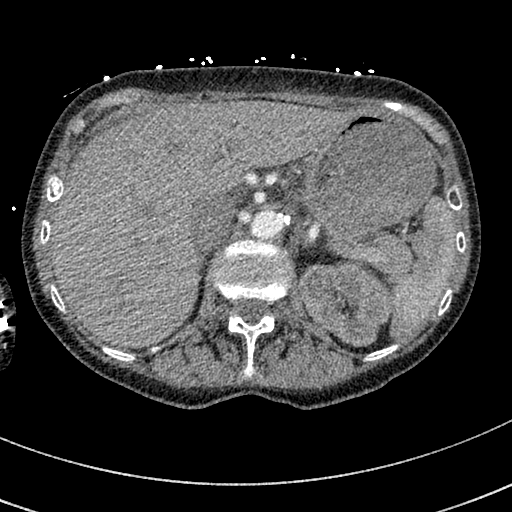
[im 12/160  lung]
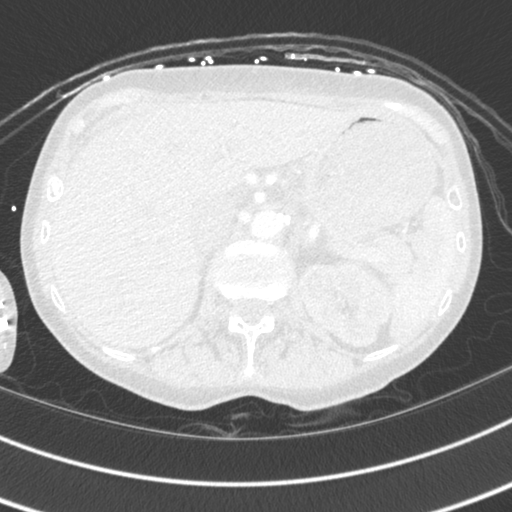
[im 24/160  lung]
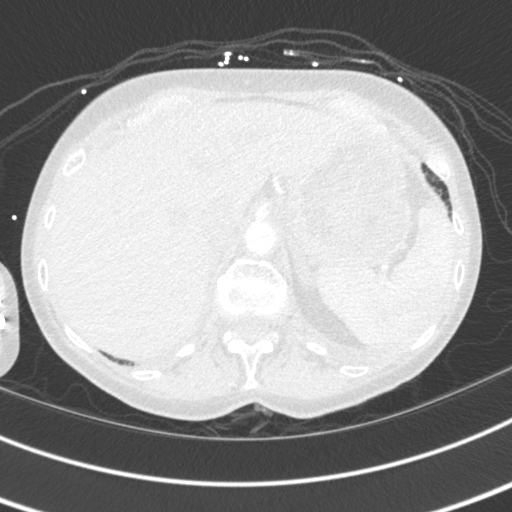
[im 36/160  lung]
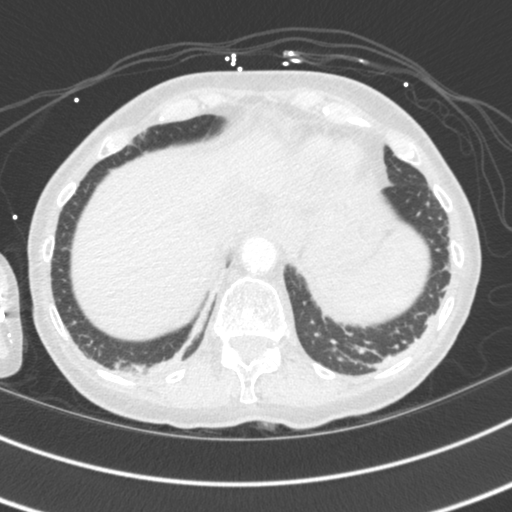
[im 48/160  lung]
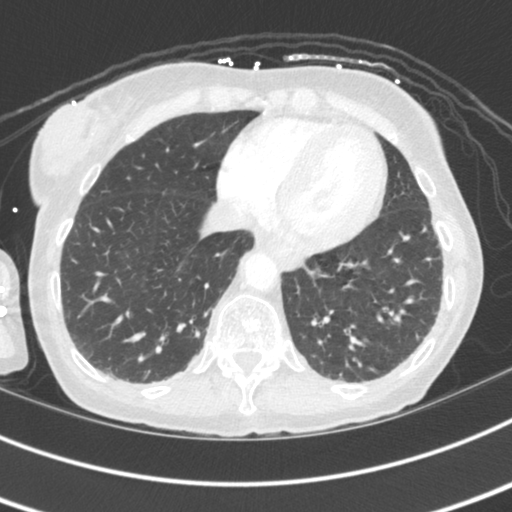
[im 59/160  mediastinal]
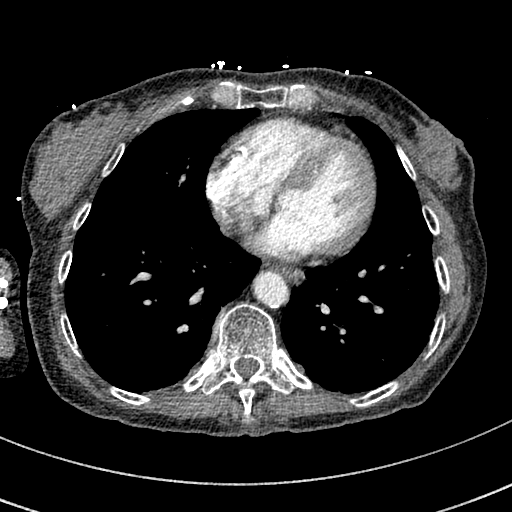
[im 59/160  lung]
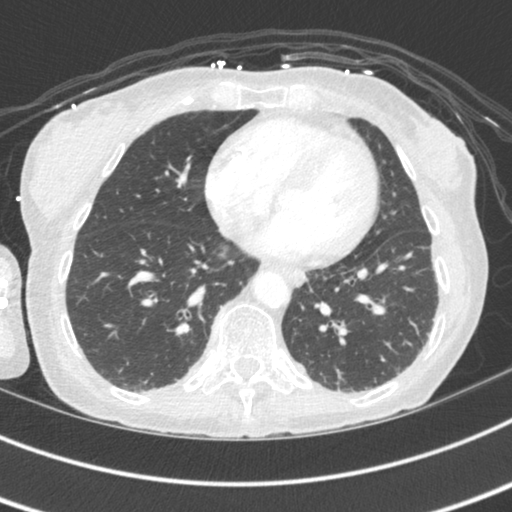
[im 71/160  lung]
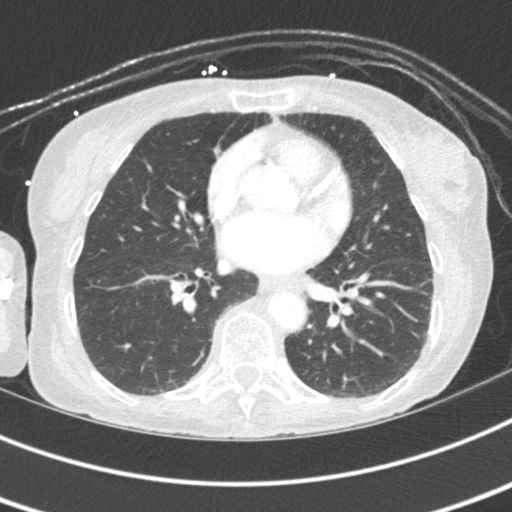
[im 89/160  lung]
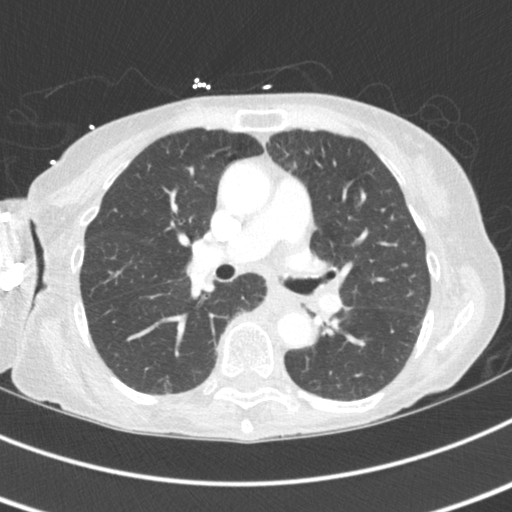
[im 101/160  lung]
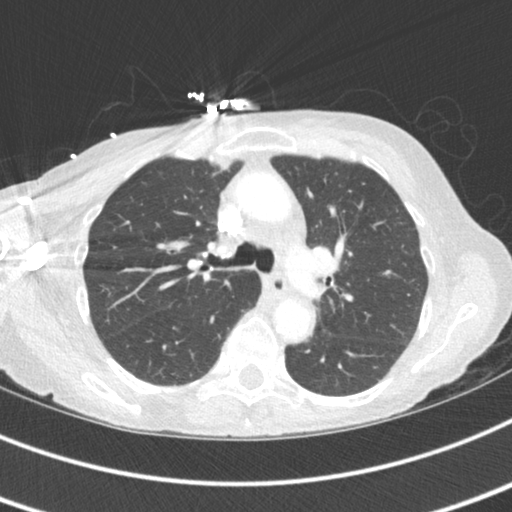
[im 112/160  mediastinal]
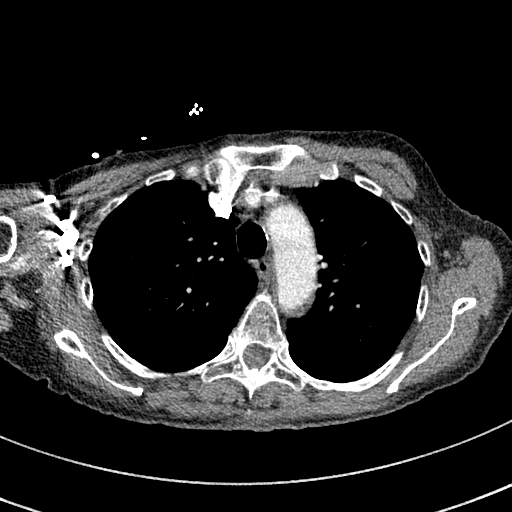
[im 112/160  lung]
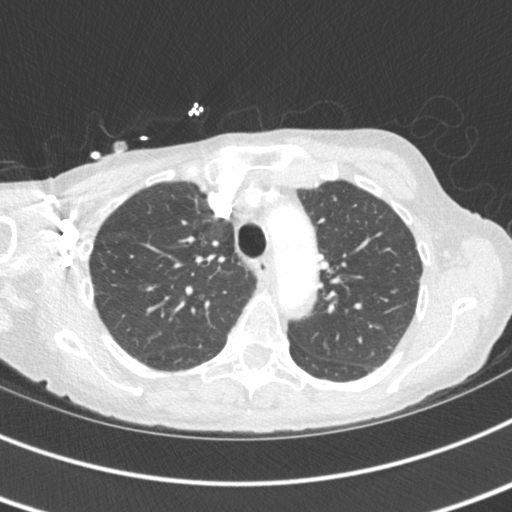
[im 124/160  lung]
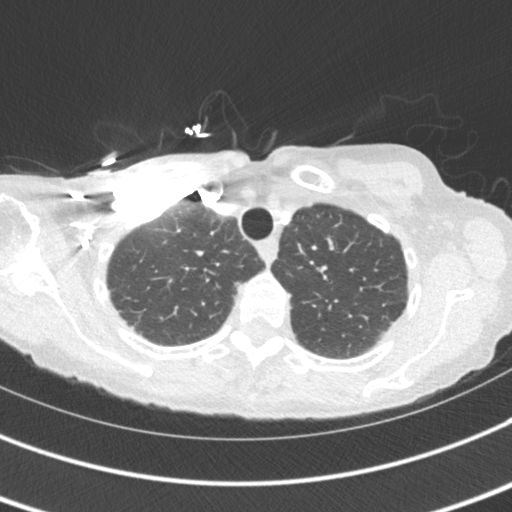
[im 136/160  lung]
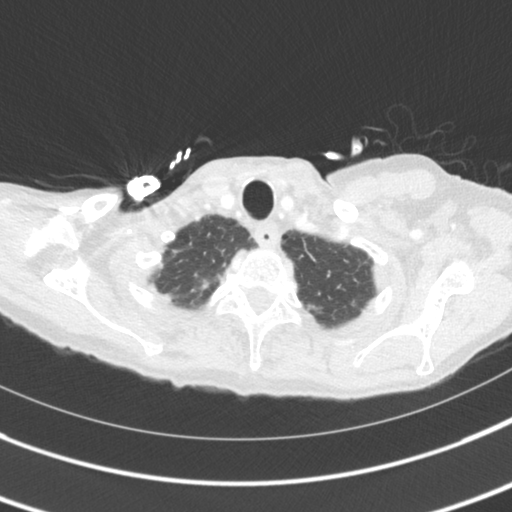
[im 148/160  lung]
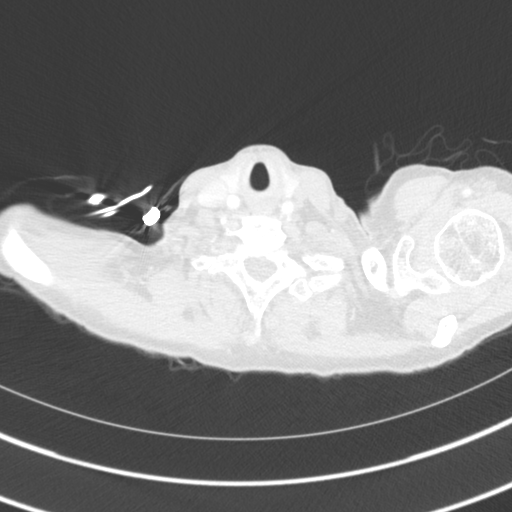

[Series 6: chest with 2mm st cor · coronal · 0.62mm/px · 3 of 151 slices shown]
[im 31/151  lung]
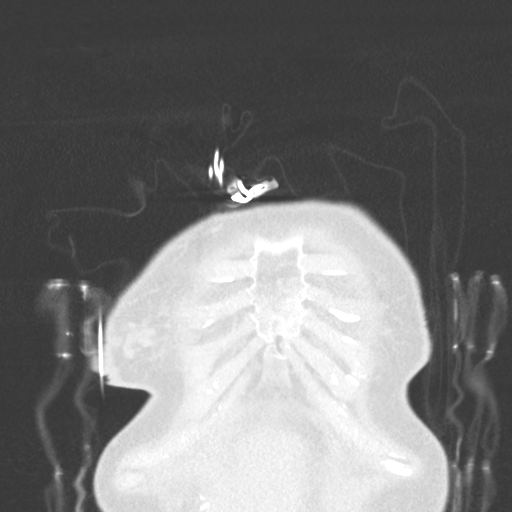
[im 61/151  lung]
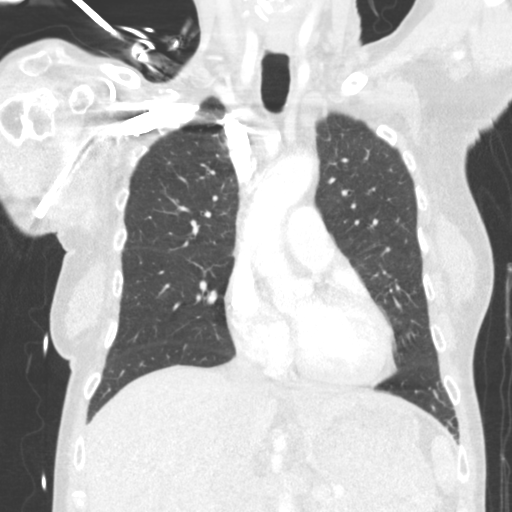
[im 91/151  lung]
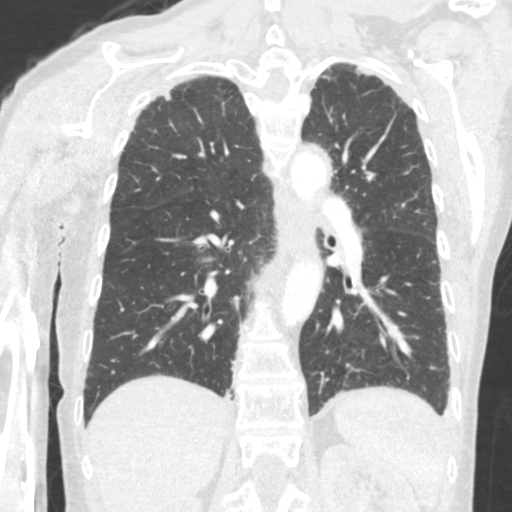

[15 of 36 positions shown; findings below may reference images not displayed]

RADIATION DOSE REDUCTION: This exam was performed according to the
departmental dose-optimization program which includes automated
exposure control, adjustment of the mA and/or kV according to
patient size and/or use of iterative reconstruction technique.

CONTRAST:  100mL OMNIPAQUE IOHEXOL 300 MG/ML  SOLN
FINDINGS: Cardiovascular: Normal heart size. No pericardial effusion. Aortic
atherosclerosis. Coronary artery calcifications. Stent within the
LAD coronary artery noted.

Mediastinum/Nodes: No enlarged mediastinal, hilar, or axillary lymph
nodes. Thyroid gland, trachea, and esophagus demonstrate no
significant findings.

Lungs/Pleura: No pleural effusion. Pleuroparenchymal scarring versus
sub pleural atelectasis noted within both lung bases, left greater
than right. No pleural effusion identified. No signs of interstitial
edema or airspace consolidation. Perifissural nodule along the minor
fissure measures 5 mm, image 89/5.

Upper Abdomen: No acute abnormality.

Musculoskeletal: Age-indeterminate compression fractures are
identified at T7 and T9 with loss of greater than 50% of the
vertebral body height. No other acute or suspicious osseous findings
identified
IMPRESSION: 1. No signs of pleural effusion or airspace consolidation.
2. Pleuroparenchymal scarring versus sub pleural atelectasis noted
within both lung bases, left greater than right.
3. Age-indeterminate compression fractures at T7 and T9 with loss of
greater than 50% of the vertebral body heights.
4. Perifissural nodule along the minor fissure measures 5 mm. No
routine follow-up imaging is recommended per [HOSPITAL]
Guidelines.
5. Aortic Atherosclerosis ([id number]).
6. Coronary artery calcifications

## 2023-10-20 ENCOUNTER — Other Ambulatory Visit: Payer: Self-pay

## 2023-10-20 ENCOUNTER — Inpatient Hospital Stay (HOSPITAL_COMMUNITY)
Admission: EM | Admit: 2023-10-20 | Discharge: 2023-10-29 | DRG: 481 | Disposition: A | Discharge status: Home / Self Care | Payer: Medicare PPO | Attending: Internal Medicine | Admitting: Internal Medicine

## 2023-10-20 ENCOUNTER — Emergency Department (HOSPITAL_COMMUNITY): Payer: Medicare PPO

## 2023-10-20 DIAGNOSIS — M25552 Pain in left hip: Secondary | ICD-10-CM | POA: Diagnosis present

## 2023-10-20 DIAGNOSIS — Z9861 Coronary angioplasty status: Secondary | ICD-10-CM | POA: Diagnosis not present

## 2023-10-20 DIAGNOSIS — E785 Hyperlipidemia, unspecified: Secondary | ICD-10-CM | POA: Diagnosis present

## 2023-10-20 DIAGNOSIS — E871 Hypo-osmolality and hyponatremia: Secondary | ICD-10-CM | POA: Diagnosis present

## 2023-10-20 DIAGNOSIS — S72002D Fracture of unspecified part of neck of left femur, subsequent encounter for closed fracture with routine healing: Secondary | ICD-10-CM | POA: Diagnosis not present

## 2023-10-20 DIAGNOSIS — Z681 Body mass index (BMI) 19 or less, adult: Secondary | ICD-10-CM | POA: Diagnosis not present

## 2023-10-20 DIAGNOSIS — E876 Hypokalemia: Secondary | ICD-10-CM | POA: Diagnosis present

## 2023-10-20 DIAGNOSIS — D649 Anemia, unspecified: Secondary | ICD-10-CM | POA: Diagnosis present

## 2023-10-20 DIAGNOSIS — Z8679 Personal history of other diseases of the circulatory system: Secondary | ICD-10-CM

## 2023-10-20 DIAGNOSIS — W010XXA Fall on same level from slipping, tripping and stumbling without subsequent striking against object, initial encounter: Secondary | ICD-10-CM | POA: Diagnosis present

## 2023-10-20 DIAGNOSIS — S72142A Displaced intertrochanteric fracture of left femur, initial encounter for closed fracture: Principal | ICD-10-CM | POA: Diagnosis present

## 2023-10-20 DIAGNOSIS — Z91128 Patient's intentional underdosing of medication regimen for other reason: Secondary | ICD-10-CM | POA: Diagnosis not present

## 2023-10-20 DIAGNOSIS — Z5901 Sheltered homelessness: Secondary | ICD-10-CM | POA: Diagnosis not present

## 2023-10-20 DIAGNOSIS — E440 Moderate protein-calorie malnutrition: Secondary | ICD-10-CM | POA: Diagnosis present

## 2023-10-20 DIAGNOSIS — I2510 Atherosclerotic heart disease of native coronary artery without angina pectoris: Secondary | ICD-10-CM | POA: Diagnosis present

## 2023-10-20 DIAGNOSIS — I081 Rheumatic disorders of both mitral and tricuspid valves: Secondary | ICD-10-CM | POA: Diagnosis present

## 2023-10-20 DIAGNOSIS — Z720 Tobacco use: Secondary | ICD-10-CM

## 2023-10-20 DIAGNOSIS — Y9259 Other trade areas as the place of occurrence of the external cause: Secondary | ICD-10-CM

## 2023-10-20 DIAGNOSIS — S72002A Fracture of unspecified part of neck of left femur, initial encounter for closed fracture: Secondary | ICD-10-CM | POA: Diagnosis not present

## 2023-10-20 DIAGNOSIS — I10 Essential (primary) hypertension: Secondary | ICD-10-CM | POA: Diagnosis present

## 2023-10-20 DIAGNOSIS — I4891 Unspecified atrial fibrillation: Secondary | ICD-10-CM | POA: Diagnosis present

## 2023-10-20 DIAGNOSIS — F329 Major depressive disorder, single episode, unspecified: Secondary | ICD-10-CM | POA: Diagnosis present

## 2023-10-20 DIAGNOSIS — F339 Major depressive disorder, recurrent, unspecified: Secondary | ICD-10-CM | POA: Diagnosis not present

## 2023-10-20 DIAGNOSIS — F17210 Nicotine dependence, cigarettes, uncomplicated: Secondary | ICD-10-CM | POA: Diagnosis present

## 2023-10-20 DIAGNOSIS — M25519 Pain in unspecified shoulder: Secondary | ICD-10-CM | POA: Diagnosis present

## 2023-10-20 DIAGNOSIS — Z8249 Family history of ischemic heart disease and other diseases of the circulatory system: Secondary | ICD-10-CM

## 2023-10-20 DIAGNOSIS — Z79899 Other long term (current) drug therapy: Secondary | ICD-10-CM

## 2023-10-20 DIAGNOSIS — S72009A Fracture of unspecified part of neck of unspecified femur, initial encounter for closed fracture: Secondary | ICD-10-CM | POA: Diagnosis present

## 2023-10-20 LAB — CK: Total CK: 221 U/L (ref 38–234)

## 2023-10-20 LAB — CBC
HCT: 32.2 % — ABNORMAL LOW (ref 36.0–46.0)
Hemoglobin: 10.4 g/dL — ABNORMAL LOW (ref 12.0–15.0)
MCH: 28.9 pg (ref 26.0–34.0)
MCHC: 32.3 g/dL (ref 30.0–36.0)
MCV: 89.4 fL (ref 80.0–100.0)
Platelets: 243 10*3/uL (ref 150–400)
RBC: 3.6 MIL/uL — ABNORMAL LOW (ref 3.87–5.11)
RDW: 12.5 % (ref 11.5–15.5)
WBC: 8 10*3/uL (ref 4.0–10.5)
nRBC: 0 % (ref 0.0–0.2)

## 2023-10-20 LAB — BASIC METABOLIC PANEL
Anion gap: 10 (ref 5–15)
BUN: 20 mg/dL (ref 8–23)
CO2: 26 mmol/L (ref 22–32)
Calcium: 8.9 mg/dL (ref 8.9–10.3)
Chloride: 103 mmol/L (ref 98–111)
Creatinine, Ser: 0.58 mg/dL (ref 0.44–1.00)
GFR, Estimated: >60 mL/min (ref >60)
Glucose, Bld: 98 mg/dL (ref 70–99)
Potassium: 3 mmol/L — ABNORMAL LOW (ref 3.5–5.1)
Sodium: 139 mmol/L (ref 135–145)

## 2023-10-20 MED ORDER — MORPHINE SULFATE (PF) 2 MG/ML IV SOLN
0.5000 mg | INTRAVENOUS | Status: DC | PRN
  Administered 2023-10-22 – 2023-10-29 (×20): 0.5 mg via INTRAVENOUS
  Filled 2023-10-20 (×21): qty 1

## 2023-10-20 MED ORDER — HYDROCODONE-ACETAMINOPHEN 5-325 MG PO TABS
1.0000 | ORAL_TABLET | Freq: Four times a day (QID) | ORAL | Status: DC | PRN
  Administered 2023-10-21 – 2023-10-29 (×19): 1 via ORAL
  Filled 2023-10-20 (×22): qty 1

## 2023-10-20 MED ORDER — SENNA 8.6 MG PO TABS
1.0000 | ORAL_TABLET | Freq: Two times a day (BID) | ORAL | Status: DC
Start: 2023-10-21 — End: 2023-10-29
  Administered 2023-10-21 – 2023-10-27 (×14): 8.6 mg via ORAL
  Filled 2023-10-20 (×17): qty 1

## 2023-10-20 MED ORDER — MORPHINE SULFATE (PF) 4 MG/ML IV SOLN
4.0000 mg | Freq: Once | INTRAVENOUS | Status: AC
  Administered 2023-10-20: 4 mg via INTRAVENOUS
  Filled 2023-10-20: qty 1

## 2023-10-20 MED ORDER — POTASSIUM CHLORIDE CRYS ER 20 MEQ PO TBCR
40.0000 meq | EXTENDED_RELEASE_TABLET | Freq: Once | ORAL | Status: AC
  Administered 2023-10-20: 40 meq via ORAL
  Filled 2023-10-20: qty 2

## 2023-10-20 NOTE — ED Triage Notes (Signed)
Patient with LLE pain after mechanical fall on Friday. Unable to bear weight on same. Denies dizziness or syncope prior to fall. She laid on the floor from 0400 when she fell until 1730 that afternoon d/t inability to get up. Did hit her head but denies headache or syncope.

## 2023-10-20 NOTE — Consult Note (Signed)
[facility]  ORTHOPAEDIC CONSULTATION  REQUESTING PHYSICIAN: Lorre Nick, MD  ASSESSMENT AND PLAN: 72 y.o. female with the following: Left Hip Intertrochanteric femur fracture  This patient requires inpatient admission to the hospitalist, to include preoperative clearance and perioperative medical management  - Weight Bearing Status/Activity: NWB Left lower extremity  - Additional recommended labs/tests:   -VTE Prophylaxis: Please hold prior to OR; to resume POD#1 at the discretion of the primary team  - Pain control: Recommend PO pain medications PRN; judicious use of narcotics  - Follow-up plan: F/u 10-14 days postop  -Procedures: Plan for OR once patient has been medically optimized  Plan for Left Cephalomedullary nail     Chief Complaint: Left hip pain  HPI: Shannon Burgess is a 72 y.o. female who presented to the ED for evaluation after sustaining a mechanical fall. She states she fell several days ago and was unable to get off the floor. She is complaining of Left hip pain.   Past Medical History:  Diagnosis Date   CAD (coronary artery disease)    Hyperlipidemia    Hypertension    Polysubstance abuse (HCC)    cocaine, heroine, methamphetamine   Tobacco abuse    No past surgical history on file. Social History   Socioeconomic History   Marital status: Divorced    Spouse name: Not on file   Number of children: Not on file   Years of education: Not on file   Highest education level: Not on file  Occupational History   Not on file  Tobacco Use   Smoking status: Every Day    Current packs/day: 1.00    Types: Cigarettes   Smokeless tobacco: Not on file  Vaping Use   Vaping status: Never Used  Substance and Sexual Activity   Alcohol use: Not Currently   Drug use: Yes    Types: "Crack" cocaine   Sexual activity: Not on file  Other Topics Concern   Not on file  Social History Narrative   Not on file   Social Determinants of Health   Financial Resource Strain:  Not on file  Food Insecurity: Not on file  Transportation Needs: Not on file  Physical Activity: Not on file  Stress: Not on file  Social Connections: Not on file   Family History  Problem Relation Age of Onset   CAD Father    Heart attack Father    Stroke Brother    No Known Allergies Prior to Admission medications   Medication Sig Start Date End Date Taking? Authorizing Provider  aspirin EC 81 MG tablet Take 1 tablet (81 mg total) by mouth daily. Swallow whole. Patient not taking: Reported on 10/20/2023 05/05/22   Marolyn Haller, MD  atorvastatin (LIPITOR) 80 MG tablet Take 1 tablet (80 mg total) by mouth at bedtime. Patient not taking: Reported on 10/20/2023 05/04/22 10/31/22  Marolyn Haller, MD  DULoxetine (CYMBALTA) 60 MG capsule Take 1 capsule (60 mg total) by mouth daily. Patient not taking: Reported on 10/20/2023 05/14/22   Marolyn Haller, MD  hydrOXYzine (ATARAX) 25 MG tablet Take 1 tablet (25 mg total) by mouth 3 (three) times daily as needed for anxiety. Patient not taking: Reported on 10/20/2023 05/14/22   Marolyn Haller, MD  Iron, Ferrous Sulfate, 325 (65 Fe) MG TABS Take 1 tablet by mouth daily. Patient not taking: Reported on 10/20/2023 05/04/22   Marolyn Haller, MD  Multiple Vitamin (MULTIVITAMIN WITH MINERALS) TABS tablet Take 1 tablet by mouth daily. Patient not taking: Reported on  10/20/2023 05/04/22   Marolyn Haller, MD  nicotine (NICODERM CQ - DOSED IN MG/24 HOURS) 21 mg/24hr patch Place 1 patch (21 mg total) onto the skin daily. Patient not taking: Reported on 05/03/2022 05/03/22   Mariel Craft, MD  polyethylene glycol (MIRALAX / GLYCOLAX) 17 g packet Take 17 g by mouth daily as needed for mild constipation. Patient not taking: Reported on 10/20/2023 05/04/22   Marolyn Haller, MD  risperiDONE (RISPERDAL) 2 MG tablet Take 1 tablet (2 mg total) by mouth 2 (two) times daily. Patient not taking: Reported on 10/20/2023 05/14/22   Marolyn Haller, MD   traZODone (DESYREL) 100 MG tablet Take 1 tablet (100 mg total) by mouth at bedtime as needed for sleep. Patient not taking: Reported on 10/20/2023 05/14/22   Marolyn Haller, MD   DG Chest Port 1 View  Result Date: 10/20/2023 CLINICAL DATA:  Preop EXAM: PORTABLE CHEST 1 VIEW COMPARISON:  Chest x-ray 05/03/2022 FINDINGS: The heart size and mediastinal contours are within normal limits. Both lungs are clear. The visualized skeletal structures are unremarkable. IMPRESSION: No active disease. Electronically Signed   By: Darliss Cheney M.D.   On: 10/20/2023 22:45   DG Pelvis 1-2 Views  Result Date: 10/20/2023 CLINICAL DATA:  Pain after fall. Proximal femoral pain. Mechanical fall on Friday. Unable to bear weight. EXAM: PELVIS - 1-2 VIEW; LEFT FEMUR 2 VIEWS COMPARISON:  None Available. FINDINGS: Fracture of the proximal femur is best appreciated on the lateral femoral view, appears to be intertrochanteric. This cannot be further characterized on the frontal views. No hip dislocation. 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 No pubic symphyseal or sacroiliac diastasis. The distal femur is intact. Normal knee alignment. IMPRESSION: Acute fracture of the proximal femur, only appreciated on the cross-table lateral view. This is likely intertrochanteric, however recommend characterization with CT to further delineate the fracture. Electronically Signed   By: Narda Rutherford M.D.   On: 10/20/2023 17:53   DG FEMUR MIN 2 VIEWS LEFT  Result Date: 10/20/2023 CLINICAL DATA:  Pain after fall. Proximal femoral pain. Mechanical fall on Friday. Unable to bear weight. EXAM: PELVIS - 1-2 VIEW; LEFT FEMUR 2 VIEWS COMPARISON:  None Available. FINDINGS: Fracture of the proximal femur is best appreciated on the lateral femoral view, appears to be intertrochanteric. This cannot be further characterized on the frontal views. No hip dislocation. 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 No pubic symphyseal or sacroiliac diastasis.  The distal femur is intact. Normal knee alignment. IMPRESSION: Acute fracture of the proximal femur, only appreciated on the cross-table lateral view. This is likely intertrochanteric, however recommend characterization with CT to further delineate the fracture. Electronically Signed   By: Narda Rutherford M.D.   On: 10/20/2023 17:53   Family History Reviewed and non-contributory, no pertinent history of problems with bleeding or anesthesia    Review of Systems No fevers or chills No numbness or tingling No chest pain No shortness of breath No bowel or bladder dysfunction No GI distress No headaches    OBJECTIVE  Vitals:Patient Vitals for the past 8 hrs:  BP Temp Temp src Pulse Resp SpO2 Height Weight  10/20/23 2215 -- -- -- 72 (!) 24 90 % -- --  10/20/23 2214 (!) 161/138 97.8 F (36.6 C) Axillary 90 17 100 % -- --  10/20/23 2200 -- -- -- 82 19 100 % -- --  10/20/23 2145 -- -- -- 84 (!) 23 100 % -- --  10/20/23 2130 -- -- -- 85 17 100 % -- --  10/20/23 2115 -- -- -- 86 19 100 % -- --  10/20/23 2100 -- -- -- 84 19 100 % -- --  10/20/23 2045 -- -- -- 85 (!) 21 97 % -- --  10/20/23 2030 -- -- -- 87 18 100 % -- --  10/20/23 2015 -- -- -- -- (!) 21 -- -- --  10/20/23 2000 -- -- -- -- 16 -- -- --  10/20/23 1945 (!) 144/115 -- -- -- -- -- -- --  10/20/23 1930 (!) 166/72 -- -- 85 -- 100 % -- --  10/20/23 1915 (!) 192/91 -- -- 85 -- 100 % -- --  10/20/23 1900 (!) 126/58 -- -- 87 -- 100 % -- --  10/20/23 1741 110/65 98.2 F (36.8 C) Oral 80 16 100 % -- --  10/20/23 1650 -- -- -- -- -- -- 5\' 4"  (1.626 m) 39.5 kg   General: Alert, no acute distress Cardiovascular: Extremities are warm Respiratory: No cyanosis, no use of accessory musculature Skin: No lesions in the area of chief complaint  Neurologic: Sensation intact distally  Psychiatric: Patient is competent for consent with normal mood and affect Lymphatic: No swelling obvious and reported other than the area involved in the  exam below Extremities  Left LE: Extremity held in a fixed position. Pain with log roll and axial load. ROM deferred due to known fracture.  Sensation is intact distally in the sural, saphenous, DP, SP, and plantar nerve distribution. 2+ DP pulse.  Toes are WWP.  Active motion intact in the TA/EHL/GS. Right LE: Sensation is intact distally in the sural, saphenous, DP, SP, and plantar nerve distribution. 2+ DP pulse.  Toes are WWP.  Active motion intact in the TA/EHL/GS. Tolerates gentle ROM of the hip.  No pain with axial loading.     Test Results Imaging XR of the Left demonstrates a Intertrochanteric femur fracture.  Labs cbc Recent Labs    10/20/23 1810  WBC 8.0  HGB 10.4*  HCT 32.2*  PLT 243    Labs inflam No results for input(s): "CRP" in the last 72 hours.  Invalid input(s): "ESR"  Labs coag No results for input(s): "INR", "PTT" in the last 72 hours.  Invalid input(s): "PT"  Recent Labs    10/20/23 1810  NA 139  K 3.0*  CL 103  CO2 26  GLUCOSE 98  BUN 20  CREATININE 0.58  CALCIUM 8.9

## 2023-10-20 NOTE — ED Notes (Signed)
Pt given a snack bag

## 2023-10-20 NOTE — Consult Note (Signed)
Brief Consult Note- Full consult note to follow in AM  History gathered ed from chart review  Shannon Burgess is a 72 year old female who sustained a ground level fall several days ago. She has been unable to ambulate since the fall.  XR demonstrate a left hip intertrochanteric fracture  Plan: She will under go left hip cephalomedullary nailing tomorrow afternoon  -Medical optimization -Hold AM dvt ppx, HSQ or Lovenox ok for PM dose tonight -NPO at midnight

## 2023-10-20 NOTE — ED Provider Notes (Signed)
[facility] AT [facility] Provider Note   CSN: [id number] Arrival date & time: 10/20/23  1636     History  Chief Complaint  Patient presents with   Leg Injury    Meta Pugliese is a 72 y.o. female.  72 year old female presents with left hip pain after fall several days ago.  States that she was able to get off the floor.  Complains of sharp pain to her left hip that is worse with movement.  Also complains of some left knee pain.  Denies any head trauma or neck trauma from the fall.  No chest or abdominal discomfort.  Called EMS and transported here     Home Medications Prior to Admission medications   Medication Sig Start Date End Date Taking? Authorizing Provider  aspirin EC 81 MG tablet Take 1 tablet (81 mg total) by mouth daily. Swallow whole. 05/05/22   Marolyn Haller, MD  atorvastatin (LIPITOR) 80 MG tablet Take 1 tablet (80 mg total) by mouth at bedtime. 05/04/22 10/31/22  Marolyn Haller, MD  DULoxetine (CYMBALTA) 60 MG capsule Take 1 capsule (60 mg total) by mouth daily. 05/14/22   Marolyn Haller, MD  hydrOXYzine (ATARAX) 25 MG tablet Take 1 tablet (25 mg total) by mouth 3 (three) times daily as needed for anxiety. 05/14/22   Marolyn Haller, MD  Iron, Ferrous Sulfate, 325 (65 Fe) MG TABS Take 1 tablet by mouth daily. 05/04/22   Marolyn Haller, MD  Multiple Vitamin (MULTIVITAMIN WITH MINERALS) TABS tablet Take 1 tablet by mouth daily. 05/04/22   Marolyn Haller, MD  nicotine (NICODERM CQ - DOSED IN MG/24 HOURS) 21 mg/24hr patch Place 1 patch (21 mg total) onto the skin daily. Patient not taking: Reported on 05/03/2022 05/03/22   Mariel Craft, MD  polyethylene glycol (MIRALAX / GLYCOLAX) 17 g packet Take 17 g by mouth daily as needed for mild constipation. 05/04/22   Marolyn Haller, MD  risperiDONE (RISPERDAL) 2 MG tablet Take 1 tablet (2 mg total) by mouth 2 (two) times daily. 05/14/22   Marolyn Haller, MD  traZODone (DESYREL) 100 MG  tablet Take 1 tablet (100 mg total) by mouth at bedtime as needed for sleep. 05/14/22   Marolyn Haller, MD      Allergies    Patient has no known allergies.    Review of Systems   Review of Systems  All other systems reviewed and are negative.  Physical Exam Updated Vital Signs BP 110/65 (BP Location: Left Arm)   Pulse 80   Temp 98.2 F (36.8 C) (Oral)   Resp 16   Ht 1.626 m (5\' 4" )   Wt 39.5 kg   SpO2 100%   BMI 14.93 kg/m  Physical Exam Vitals and nursing note reviewed.  Constitutional:      General: She is not in acute distress.    Appearance: Normal appearance. She is well-developed. She is not toxic-appearing.  HENT:     Head: Normocephalic and atraumatic.  Eyes:     General: Lids are normal.     Conjunctiva/sclera: Conjunctivae normal.     Pupils: Pupils are equal, round, and reactive to light.  Neck:     Thyroid: No thyroid mass.     Trachea: No tracheal deviation.  Cardiovascular:     Rate and Rhythm: Normal rate and regular rhythm.     Heart sounds: Normal heart sounds. No murmur heard.    No gallop.  Pulmonary:     Effort: Pulmonary effort is  normal. No respiratory distress.     Breath sounds: Normal breath sounds. No stridor. No decreased breath sounds, wheezing, rhonchi or rales.  Abdominal:     General: There is no distension.     Palpations: Abdomen is soft.     Tenderness: There is no abdominal tenderness. There is no rebound.  Musculoskeletal:     Cervical back: Normal range of motion and neck supple.     Left hip: Tenderness and bony tenderness present. Decreased range of motion.     Left knee: Normal range of motion. Tenderness present.     Comments: Neurovascular status intact at left foot  Skin:    General: Skin is warm and dry.     Findings: No abrasion or rash.  Neurological:     Mental Status: She is alert and oriented to person, place, and time. Mental status is at baseline.     GCS: GCS eye subscore is 4. GCS verbal subscore is 5. GCS  motor subscore is 6.     Cranial Nerves: Cranial nerves are intact. No cranial nerve deficit.     Sensory: No sensory deficit.     Motor: Motor function is intact.  Psychiatric:        Attention and Perception: Attention normal.        Speech: Speech normal.        Behavior: Behavior normal.   ED Results / Procedures / Treatments   Labs (all labs ordered are listed, but only abnormal results are displayed) Labs Reviewed  CBC - Abnormal; Notable for the following components:      Result Value   RBC 3.60 (*)    Hemoglobin 10.4 (*)    HCT 32.2 (*)    All other components within normal limits  BASIC METABOLIC PANEL - Abnormal; Notable for the following components:   Potassium 3.0 (*)    All other components within normal limits  CK    EKG None  Radiology DG Pelvis 1-2 Views  Result Date: 10/20/2023 CLINICAL DATA:  Pain after fall. Proximal femoral pain. Mechanical fall on Friday. Unable to bear weight. EXAM: PELVIS - 1-2 VIEW; LEFT FEMUR 2 VIEWS COMPARISON:  None Available. FINDINGS: Fracture of the proximal femur is best appreciated on the lateral femoral view, appears to be intertrochanteric. This cannot be further characterized on the frontal views. No hip dislocation. 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 No pubic symphyseal or sacroiliac diastasis. The distal femur is intact. Normal knee alignment. IMPRESSION: Acute fracture of the proximal femur, only appreciated on the cross-table lateral view. This is likely intertrochanteric, however recommend characterization with CT to further delineate the fracture. Electronically Signed   By: Narda Rutherford M.D.   On: 10/20/2023 17:53   DG FEMUR MIN 2 VIEWS LEFT  Result Date: 10/20/2023 CLINICAL DATA:  Pain after fall. Proximal femoral pain. Mechanical fall on Friday. Unable to bear weight. EXAM: PELVIS - 1-2 VIEW; LEFT FEMUR 2 VIEWS COMPARISON:  None Available. FINDINGS: Fracture of the proximal femur is best appreciated on the  lateral femoral view, appears to be intertrochanteric. This cannot be further characterized on the frontal views. No hip dislocation. 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 No pubic symphyseal or sacroiliac diastasis. The distal femur is intact. Normal knee alignment. IMPRESSION: Acute fracture of the proximal femur, only appreciated on the cross-table lateral view. This is likely intertrochanteric, however recommend characterization with CT to further delineate the fracture. Electronically Signed   By: Narda Rutherford M.D.   On: 10/20/2023  17:53    Procedures Procedures    Medications Ordered in ED Medications  morphine (PF) 4 MG/ML injection 4 mg (has no administration in time range)    ED Course/ Medical Decision Making/ A&P                                 Medical Decision Making Amount and/or Complexity of Data Reviewed Radiology: ordered.  Risk Prescription drug management.   Patient with pain to her left knee and offer x-rays which she has deferred.  Left hip x-ray is positive for proximal femur fracture.  Will consult orthopedics and admit to the medicine team.        Final Clinical Impression(s) / ED Diagnoses Final diagnoses:  None    Rx / DC Orders ED Discharge Orders     None         Lorre Nick, MD 10/20/23 1923

## 2023-10-20 NOTE — H&P (Signed)
History and Physical    Patient: Shannon Burgess WJX:[id number] DOB: 1951-10-09 DOA: 10/20/2023 DOS: the patient was seen and examined on 10/21/2023 PCP: Pcp, No  Patient coming from: Home  Chief Complaint:  Chief Complaint  Patient presents with   Leg Injury   HPI: Shannon Burgess is a 72 y.o. female with medical history significant of HTN, atrial fibrillation not on anticoagulation, CAD s/p stents x2,HLD, MDD, polysubstance abuse, homelessness who presents following mechanical fall.   Pt was at her hotel 3 days ago when she turned to look at the TV and thinks she tripped on her own feet and fell hitting her left side. Normally ambulates with cane but did not have it with her. She was unable to get up for 18 hours but eventually was able to crawl for help. She continued to have left sided hip and shoulder pain and presented to ED.  She denies any dizziness, chest pain or palpitation.  Pt smokes 1/2 pack daily, denies alcohol or illicit drug use.  She stopped all her medications and medical care just prior to COVID pandemic. Denies any anginal symptoms or shortness of breath at baseline. Able to perform all ADLs.   On arrival to ED, she was afebrile, normotensive on room air.   CBC without leukocytosis, anemia with hemoglobin of 10.4.  BMP notable for hypokalemia of 3.0.   CXR is negative.   Pelvic and femur X-ray acute fracture of the proximal intertrochanteric femur fracture.   Orthopedic Dr. Hulda Humphrey has evaluated and will take for left hip cephalomeduallary nailing tomorrow.   Review of Systems: As mentioned in the history of present illness. All other systems reviewed and are negative. Past Medical History:  Diagnosis Date   CAD (coronary artery disease)    Hyperlipidemia    Hypertension    Polysubstance abuse (HCC)    cocaine, heroine, methamphetamine   Tobacco abuse    No past surgical history on file. Social History:  reports that she has been smoking cigarettes. She does  not have any smokeless tobacco history on file. She reports that she does not currently use alcohol. She reports current drug use. Drug: "Crack" cocaine.  No Known Allergies  Family History  Problem Relation Age of Onset   CAD Father    Heart attack Father    Stroke Brother     Prior to Admission medications   Medication Sig Start Date End Date Taking? Authorizing Provider  aspirin EC 81 MG tablet Take 1 tablet (81 mg total) by mouth daily. Swallow whole. Patient not taking: Reported on 10/20/2023 05/05/22   Marolyn Haller, MD  atorvastatin (LIPITOR) 80 MG tablet Take 1 tablet (80 mg total) by mouth at bedtime. Patient not taking: Reported on 10/20/2023 05/04/22 10/31/22  Marolyn Haller, MD  DULoxetine (CYMBALTA) 60 MG capsule Take 1 capsule (60 mg total) by mouth daily. Patient not taking: Reported on 10/20/2023 05/14/22   Marolyn Haller, MD  hydrOXYzine (ATARAX) 25 MG tablet Take 1 tablet (25 mg total) by mouth 3 (three) times daily as needed for anxiety. Patient not taking: Reported on 10/20/2023 05/14/22   Marolyn Haller, MD  Iron, Ferrous Sulfate, 325 (65 Fe) MG TABS Take 1 tablet by mouth daily. Patient not taking: Reported on 10/20/2023 05/04/22   Marolyn Haller, MD  Multiple Vitamin (MULTIVITAMIN WITH MINERALS) TABS tablet Take 1 tablet by mouth daily. Patient not taking: Reported on 10/20/2023 05/04/22   Marolyn Haller, MD  nicotine (NICODERM CQ - DOSED IN MG/24 HOURS) 21 mg/24hr  patch Place 1 patch (21 mg total) onto the skin daily. Patient not taking: Reported on 05/03/2022 05/03/22   Mariel Craft, MD  polyethylene glycol (MIRALAX / GLYCOLAX) 17 g packet Take 17 g by mouth daily as needed for mild constipation. Patient not taking: Reported on 10/20/2023 05/04/22   Marolyn Haller, MD  risperiDONE (RISPERDAL) 2 MG tablet Take 1 tablet (2 mg total) by mouth 2 (two) times daily. Patient not taking: Reported on 10/20/2023 05/14/22   Marolyn Haller, MD  traZODone  (DESYREL) 100 MG tablet Take 1 tablet (100 mg total) by mouth at bedtime as needed for sleep. Patient not taking: Reported on 10/20/2023 05/14/22   Marolyn Haller, MD    Physical Exam: Vitals:   10/20/23 2200 10/20/23 2214 10/20/23 2215 10/21/23 0000  BP:  (!) 161/138  (!) 153/95  Pulse: 82 90 72 87  Resp: 19 17 (!) 24 20  Temp:  97.8 F (36.6 C)    TempSrc:  Axillary    SpO2: 100% 100% 90% 100%  Weight:      Height:       Constitutional: NAD, calm, comfortable, thin chronically ill appearing female lying upright in bed Eyes: lids and conjunctivae normal ENMT: Mucous membranes are moist.  Neck: normal, supple Respiratory: clear to auscultation bilaterally, no wheezing, no crackles. Normal respiratory effort. No accessory muscle use.  Cardiovascular: Regular rate and rhythm, no murmurs / rubs / gallops. No extremity edema. 2+ pedal pulses.  Abdomen: no tenderness, soft, non-distended Musculoskeletal: no clubbing / cyanosis. No joint deformity upper and lower extremities. Normal muscle tone.  Skin: no rashes, lesions, ulcers. No induration Neurologic: CN 2-12 grossly intact. Psychiatric: Normal judgment and insight. Alert and oriented x 3. Normal mood.   Data Reviewed:  See HPI  Assessment and Plan: * Hip fracture (HCC) -Pelvic and femur X-ray acute fracture of the proximal intertrochanteric femur fracture. --Orthopedic/spinal surgery is associated with an intermediate (1-5%) cardiovascular risk for cardiac death and nonfatal MI -With history of MI, the revised cardiac index gives a risk estimate of (6)% -Because of this risk, she is recommended to have pre-operative EKG testing prior to surgery; this was done in the ER -The Detsky's Modified Cardiac Risk Index score is Class (III), with a 20% cardiac risk -Pt is highly functional without anginal symptoms -It is reasonable for the patient to go to the OR without additional evaluation for clearance.     CAD S/P  percutaneous coronary angioplasty -s/p stents x 2 at [facility] in 2012 but no records -Last echocardiogram in 04/2022 with EF 60 to 65%, moderate LVH, grade 1 diastolic dysfunction, mild to moderate mitral valve regurgitation, moderate to severe tricuspid valve regurgitation -no anginal symptoms  -will need to resume aspirin, statin following hip surgery  Tobacco use -1/2 pack daily -encourage cessation  Hypokalemia -potassium of 3 -replete with oral supplementation  History of atrial fibrillation -currently in sinus rhythm -not on anticogulation  HTN (hypertension) -uncontrolled -PRN IV hydralazine with perimeters      Advance Care Planning:   Code Status: Full Code   Consults: orthopedic surgery  Family Communication: none at bedside  Severity of Illness: The appropriate patient status for this patient is INPATIENT. Inpatient status is judged to be reasonable and necessary in order to provide the required intensity of service to ensure the patient's safety. The patient's presenting symptoms, physical exam findings, and initial radiographic and laboratory data in the context of their chronic comorbidities is felt to place them at  high risk for further clinical deterioration. Furthermore, it is not anticipated that the patient will be medically stable for discharge from the hospital within 2 midnights of admission.   * I certify that at the point of admission it is my clinical judgment that the patient will require inpatient hospital care spanning beyond 2 midnights from the point of admission due to high intensity of service, high risk for further deterioration and high frequency of surveillance required.*  Author: Anselm Jungling, DO 10/21/2023 12:44 AM  For on call review [email or web address].

## 2023-10-21 ENCOUNTER — Other Ambulatory Visit: Payer: Self-pay

## 2023-10-21 ENCOUNTER — Inpatient Hospital Stay (HOSPITAL_COMMUNITY): Payer: Medicare PPO | Admitting: Anesthesiology

## 2023-10-21 ENCOUNTER — Inpatient Hospital Stay (HOSPITAL_COMMUNITY): Payer: Medicare PPO

## 2023-10-21 ENCOUNTER — Encounter (HOSPITAL_COMMUNITY)
Admission: EM | Disposition: A | Discharge status: Home / Self Care | Payer: Self-pay | Source: Home / Self Care | Attending: Internal Medicine

## 2023-10-21 ENCOUNTER — Encounter (HOSPITAL_COMMUNITY): Payer: Self-pay | Admitting: Family Medicine

## 2023-10-21 DIAGNOSIS — S72142A Displaced intertrochanteric fracture of left femur, initial encounter for closed fracture: Secondary | ICD-10-CM

## 2023-10-21 DIAGNOSIS — I10 Essential (primary) hypertension: Secondary | ICD-10-CM

## 2023-10-21 DIAGNOSIS — I2510 Atherosclerotic heart disease of native coronary artery without angina pectoris: Secondary | ICD-10-CM | POA: Diagnosis not present

## 2023-10-21 DIAGNOSIS — Z8679 Personal history of other diseases of the circulatory system: Secondary | ICD-10-CM

## 2023-10-21 DIAGNOSIS — E876 Hypokalemia: Secondary | ICD-10-CM

## 2023-10-21 DIAGNOSIS — Z720 Tobacco use: Secondary | ICD-10-CM

## 2023-10-21 HISTORY — PX: INTRAMEDULLARY (IM) NAIL INTERTROCHANTERIC: SHX5875

## 2023-10-21 LAB — SURGICAL PCR SCREEN
MRSA, PCR: NEGATIVE
Staphylococcus aureus: POSITIVE — AB

## 2023-10-21 LAB — BASIC METABOLIC PANEL
Anion gap: 10 (ref 5–15)
BUN: 13 mg/dL (ref 8–23)
CO2: 23 mmol/L (ref 22–32)
Calcium: 8.7 mg/dL — ABNORMAL LOW (ref 8.9–10.3)
Chloride: 101 mmol/L (ref 98–111)
Creatinine, Ser: 0.46 mg/dL (ref 0.44–1.00)
GFR, Estimated: >60 mL/min (ref >60)
Glucose, Bld: 109 mg/dL — ABNORMAL HIGH (ref 70–99)
Potassium: 3.2 mmol/L — ABNORMAL LOW (ref 3.5–5.1)
Sodium: 134 mmol/L — ABNORMAL LOW (ref 135–145)

## 2023-10-21 LAB — HIV ANTIBODY (ROUTINE TESTING W REFLEX): HIV Screen 4th Generation wRfx: NONREACTIVE

## 2023-10-21 SURGERY — FIXATION, FRACTURE, INTERTROCHANTERIC, WITH INTRAMEDULLARY ROD
Anesthesia: General | Site: Leg Upper | Laterality: Left

## 2023-10-21 MED ORDER — FENTANYL CITRATE (PF) 250 MCG/5ML IJ SOLN
INTRAMUSCULAR | Status: DC | PRN
  Administered 2023-10-21: 50 ug via INTRAVENOUS
  Administered 2023-10-21 (×2): 25 ug via INTRAVENOUS

## 2023-10-21 MED ORDER — PHENYLEPHRINE HCL-NACL 20-0.9 MG/250ML-% IV SOLN
INTRAVENOUS | Status: DC | PRN
  Administered 2023-10-21: 20 ug/min via INTRAVENOUS

## 2023-10-21 MED ORDER — ACETAMINOPHEN 10 MG/ML IV SOLN
1000.0000 mg | Freq: Once | INTRAVENOUS | Status: DC | PRN
  Administered 2023-10-21: 1000 mg via INTRAVENOUS

## 2023-10-21 MED ORDER — ROCURONIUM BROMIDE 10 MG/ML (PF) SYRINGE
PREFILLED_SYRINGE | INTRAVENOUS | Status: DC | PRN
  Administered 2023-10-21: 30 mg via INTRAVENOUS

## 2023-10-21 MED ORDER — HYDROMORPHONE HCL 1 MG/ML IJ SOLN
INTRAMUSCULAR | Status: AC
  Filled 2023-10-21: qty 1

## 2023-10-21 MED ORDER — ONDANSETRON HCL 4 MG/2ML IJ SOLN
INTRAMUSCULAR | Status: AC
  Filled 2023-10-21: qty 2

## 2023-10-21 MED ORDER — OXYCODONE HCL 5 MG/5ML PO SOLN: 5.0000 mg | Freq: Once | ORAL | Status: DC | PRN

## 2023-10-21 MED ORDER — DEXAMETHASONE SODIUM PHOSPHATE 10 MG/ML IJ SOLN
INTRAMUSCULAR | Status: AC
  Filled 2023-10-21: qty 1

## 2023-10-21 MED ORDER — DEXAMETHASONE SODIUM PHOSPHATE 10 MG/ML IJ SOLN
INTRAMUSCULAR | Status: DC | PRN
  Administered 2023-10-21: 5 mg via INTRAVENOUS

## 2023-10-21 MED ORDER — LIDOCAINE 2% (20 MG/ML) 5 ML SYRINGE
INTRAMUSCULAR | Status: DC | PRN
  Administered 2023-10-21: 40 mg via INTRAVENOUS

## 2023-10-21 MED ORDER — 0.9 % SODIUM CHLORIDE (POUR BTL) OPTIME
TOPICAL | Status: DC | PRN
  Administered 2023-10-21: 1000 mL

## 2023-10-21 MED ORDER — HYDROMORPHONE HCL 1 MG/ML IJ SOLN
0.5000 mg | Freq: Once | INTRAMUSCULAR | Status: AC
Start: 2023-10-21 — End: 2023-10-21
  Administered 2023-10-21: 0.5 mg via INTRAVENOUS
  Filled 2023-10-21: qty 0.5

## 2023-10-21 MED ORDER — FENTANYL CITRATE (PF) 100 MCG/2ML IJ SOLN
INTRAMUSCULAR | Status: AC
  Filled 2023-10-21: qty 2

## 2023-10-21 MED ORDER — FENTANYL CITRATE (PF) 250 MCG/5ML IJ SOLN
INTRAMUSCULAR | Status: AC
  Filled 2023-10-21: qty 5

## 2023-10-21 MED ORDER — MIDAZOLAM HCL 2 MG/2ML IJ SOLN
INTRAMUSCULAR | Status: DC | PRN
  Administered 2023-10-21: 2 mg via INTRAVENOUS

## 2023-10-21 MED ORDER — FENTANYL CITRATE (PF) 100 MCG/2ML IJ SOLN
25.0000 ug | INTRAMUSCULAR | Status: DC | PRN
  Administered 2023-10-21 (×3): 50 ug via INTRAVENOUS

## 2023-10-21 MED ORDER — HYDRALAZINE HCL 20 MG/ML IJ SOLN: 5.0000 mg | Freq: Four times a day (QID) | INTRAMUSCULAR | Status: DC | PRN

## 2023-10-21 MED ORDER — ONDANSETRON HCL 4 MG/2ML IJ SOLN
INTRAMUSCULAR | Status: DC | PRN
  Administered 2023-10-21: 4 mg via INTRAVENOUS

## 2023-10-21 MED ORDER — OXYCODONE HCL 5 MG PO TABS: 5.0000 mg | ORAL_TABLET | Freq: Once | ORAL | Status: DC | PRN

## 2023-10-21 MED ORDER — AMISULPRIDE (ANTIEMETIC) 5 MG/2ML IV SOLN: 10.0000 mg | Freq: Once | INTRAVENOUS | Status: DC | PRN

## 2023-10-21 MED ORDER — ORAL CARE MOUTH RINSE: 15.0000 mL | Freq: Once | OROMUCOSAL | Status: AC

## 2023-10-21 MED ORDER — HYDROMORPHONE HCL 1 MG/ML IJ SOLN
0.2500 mg | INTRAMUSCULAR | Status: DC | PRN
  Administered 2023-10-21 (×4): 0.5 mg via INTRAVENOUS

## 2023-10-21 MED ORDER — ACETAMINOPHEN 10 MG/ML IV SOLN
INTRAVENOUS | Status: AC
  Filled 2023-10-21: qty 100

## 2023-10-21 MED ORDER — MIDAZOLAM HCL 2 MG/2ML IJ SOLN
INTRAMUSCULAR | Status: AC
  Filled 2023-10-21: qty 2

## 2023-10-21 MED ORDER — CHLORHEXIDINE GLUCONATE 0.12 % MT SOLN
15.0000 mL | Freq: Once | OROMUCOSAL | Status: AC
  Administered 2023-10-21: 15 mL via OROMUCOSAL
  Filled 2023-10-21: qty 15

## 2023-10-21 MED ORDER — SUGAMMADEX SODIUM 200 MG/2ML IV SOLN
INTRAVENOUS | Status: DC | PRN
  Administered 2023-10-21: 100 mg via INTRAVENOUS

## 2023-10-21 MED ORDER — CEFAZOLIN SODIUM-DEXTROSE 1-4 GM/50ML-% IV SOLN
1.0000 g | Freq: Three times a day (TID) | INTRAVENOUS | Status: AC
  Administered 2023-10-21 – 2023-10-23 (×6): 1 g via INTRAVENOUS
  Filled 2023-10-21 (×7): qty 50

## 2023-10-21 MED ORDER — PROPOFOL 10 MG/ML IV BOLUS
INTRAVENOUS | Status: AC
  Filled 2023-10-21: qty 20

## 2023-10-21 MED ORDER — MUPIROCIN 2 % EX OINT
TOPICAL_OINTMENT | Freq: Two times a day (BID) | CUTANEOUS | Status: DC
  Administered 2023-10-23 – 2023-10-26 (×2): 1 via NASAL
  Filled 2023-10-21 (×3): qty 22

## 2023-10-21 MED ORDER — CEFAZOLIN SODIUM-DEXTROSE 2-3 GM-%(50ML) IV SOLR
INTRAVENOUS | Status: DC | PRN
  Administered 2023-10-21: 2 g via INTRAVENOUS

## 2023-10-21 MED ORDER — PROPOFOL 10 MG/ML IV BOLUS
INTRAVENOUS | Status: DC | PRN
  Administered 2023-10-21: 80 mg via INTRAVENOUS

## 2023-10-21 MED ORDER — LACTATED RINGERS IV SOLN: INTRAVENOUS | Status: DC

## 2023-10-21 SURGICAL SUPPLY — 46 items
ALCOHOL 70% 16 OZ (MISCELLANEOUS) ×1 IMPLANT
BAG COUNTER SPONGE SURGICOUNT (BAG) ×1 IMPLANT
BAG SPNG CNTER NS LX DISP (BAG) ×1
BIT DRILL 4.0X280 (BIT) IMPLANT
BNDG CMPR 5X6 CHSV STRCH STRL (GAUZE/BANDAGES/DRESSINGS) ×1
BNDG COHESIVE 6X5 TAN ST LF (GAUZE/BANDAGES/DRESSINGS) ×2 IMPLANT
CANISTER SUCT 3000ML PPV (MISCELLANEOUS) ×1 IMPLANT
COVER PERINEAL POST (MISCELLANEOUS) ×1 IMPLANT
COVER SURGICAL LIGHT HANDLE (MISCELLANEOUS) ×1 IMPLANT
DRAPE C-ARM 42X72 X-RAY (DRAPES) ×1 IMPLANT
DRAPE HALF SHEET 40X57 (DRAPES) IMPLANT
DRAPE INCISE IOBAN 66X45 STRL (DRAPES) ×1 IMPLANT
DRAPE STERI IOBAN 125X83 (DRAPES) ×1 IMPLANT
DRSG ADAPTIC 3X8 NADH LF (GAUZE/BANDAGES/DRESSINGS) ×1 IMPLANT
DRSG TEGADERM 4X4.75 (GAUZE/BANDAGES/DRESSINGS) IMPLANT
DURAPREP 26ML APPLICATOR (WOUND CARE) ×1 IMPLANT
ELECT CAUTERY BLADE 6.4 (BLADE) ×1 IMPLANT
ELECT REM PT RETURN 9FT ADLT (ELECTROSURGICAL)
ELECTRODE REM PT RTRN 9FT ADLT (ELECTROSURGICAL) ×1 IMPLANT
GAUZE SPONGE 4X4 12PLY STRL (GAUZE/BANDAGES/DRESSINGS) IMPLANT
GAUZE SPONGE 4X4 12PLY STRL LF (GAUZE/BANDAGES/DRESSINGS) ×1 IMPLANT
GAUZE XEROFORM 1X8 LF (GAUZE/BANDAGES/DRESSINGS) IMPLANT
GLOVE BIO SURGEON STRL SZ7.5 (GLOVE) ×2 IMPLANT
GLOVE BIOGEL PI IND STRL 8 (GLOVE) ×2 IMPLANT
GOWN STRL REUS W/ TWL LRG LVL3 (GOWN DISPOSABLE) ×1 IMPLANT
GOWN STRL REUS W/TWL LRG LVL3 (GOWN DISPOSABLE) ×1
KIT BASIN OR (CUSTOM PROCEDURE TRAY) ×1 IMPLANT
KIT TURNOVER KIT B (KITS) ×1 IMPLANT
NAIL TROCH SHORT 10X20 130D (Nail) IMPLANT
NS IRRIG 1000ML POUR BTL (IV SOLUTION) ×1 IMPLANT
PACK GENERAL/GYN (CUSTOM PROCEDURE TRAY) ×1 IMPLANT
PAD ARMBOARD 7.5X6 YLW CONV (MISCELLANEOUS) ×2 IMPLANT
PIN GUIDE THRD AR 3.2X330 (PIN) IMPLANT
SCREW CORT CAPTR 5X30 (Screw) IMPLANT
SCREW TELESCOP LAG 10.5X95 LT (Screw) IMPLANT
STAPLER SKIN PROX WIDE 3.9 (STAPLE) IMPLANT
STAPLER VISISTAT 35W (STAPLE) ×1 IMPLANT
SUT MON AB 2-0 CT1 36 (SUTURE) ×1 IMPLANT
SUT VIC AB 0 CT1 27 (SUTURE) ×1
SUT VIC AB 0 CT1 27XBRD ANBCTR (SUTURE) IMPLANT
SUT VIC AB 2-0 CT1 (SUTURE) IMPLANT
SUT VIC AB CT1 27XBRD ANBCTRL (SUTURE) ×1
TOOL ACTIVATION (INSTRUMENTS) IMPLANT
TOWEL GREEN STERILE (TOWEL DISPOSABLE) ×1 IMPLANT
TOWEL GREEN STERILE FF (TOWEL DISPOSABLE) ×1 IMPLANT
WATER STERILE IRR 1000ML POUR (IV SOLUTION) ×1 IMPLANT

## 2023-10-21 NOTE — Assessment & Plan Note (Signed)
-  uncontrolled -PRN IV hydralazine with perimeters

## 2023-10-21 NOTE — Progress Notes (Signed)
[facility]  PROGRESS NOTE  Shannon Burgess ZOX:[id number] DOB: [birth year] DOA: 10/20/2023 PCP: Pcp, No   Brief HPI:    72 y.o. female with medical history significant of HTN, atrial fibrillation not on anticoagulation, CAD s/p stents x2,HLD, MDD, polysubstance abuse, homelessness who presents following mechanical fall.  Patient fell at hotel, was unable to get up for 18 hours, was able to crawl for help.  Had left-sided hip and shoulder pain.  Came to ED.  Pelvic and femur x-ray showed acute fracture of the proximal intertrochanteric femur fracture.  Orthopedic surgery was consulted.    Assessment/Plan:   Hip fracture -X-ray showed proximal intertrochanteric femur fracture -Orthopedic surgery consulted -Plan for left hip cephalomedullary nail today -DVT prophylaxis per orthopedics  CAD S/P percutaneous coronary angioplasty -s/p stents x 2 at [facility] in 2012 but no records -Last echocardiogram in 04/2022 with EF 60 to 65%, moderate LVH, grade 1 diastolic dysfunction, mild to moderate mitral valve regurgitation, moderate to severe tricuspid valve regurgitation -no anginal symptoms  -will need to resume aspirin, statin following hip surgery  Hypokalemia -Potassium was replaced -Will check BMP today  History of atrial fibrillation -Currently in normal sinus rhythm -Patient is not on anticoagulation  Hypertension -Continue as needed hydralazine    Medications     [MAR Hold] mupirocin ointment   Nasal BID   [MAR Hold] senna  1 tablet Oral BID     Data Reviewed:   CBG:  No results for input(s): "GLUCAP" in the last 168 hours.  SpO2: 100 %    Vitals:   10/21/23 0113 10/21/23 0156 10/21/23 0746 10/21/23 1426  BP: 119/79  (!) 178/86 133/82  Pulse: 84  75 77  Resp: 16  19 18   Temp: 98.6 F (37 C)  97.8 F (36.6 C) 97.8 F (36.6 C)  TempSrc: Oral  Oral Oral  SpO2: 97%  100% 100%  Weight:  39.5 kg    Height:  5\' 3"  (1.6 m)        Data Reviewed:  Basic  Metabolic Panel: Recent Labs  Lab 10/20/23 1810  NA 139  K 3.0*  CL 103  CO2 26  GLUCOSE 98  BUN 20  CREATININE 0.58  CALCIUM 8.9    CBC: Recent Labs  Lab 10/20/23 1810  WBC 8.0  HGB 10.4*  HCT 32.2*  MCV 89.4  PLT 243    LFT No results for input(s): "AST", "ALT", "ALKPHOS", "BILITOT", "PROT", "ALBUMIN" in the last 168 hours.   Antibiotics: Anti-infectives (From admission, onward)    None        DVT prophylaxis: SCDs  Code Status: Full code  Family Communication: No family at bedside   CONSULTS orthopedics   Subjective   Still complains of pain, wants stronger pain medication   Objective    Physical Examination:   General-appears in no acute distress Heart-S1-S2, regular, no murmur auscultated Lungs-clear to auscultation bilaterally Abdomen-soft, nontender, no organomegaly Extremities-no edema in the lower extremities Neuro-alert, oriented x3, no focal deficit noted  Status is: Inpatient:             Meredeth Ide   [facility] If 7PM-7AM, please contact night-coverage at [email or web address], Office  [phone]   10/21/2023, 4:19 PM  LOS: 1 day

## 2023-10-21 NOTE — Progress Notes (Signed)
Pt states that she is homeless along with her son, they both stay in different motel rooms and she draws money from social security to pay for the rooms. She states that her son helps some days when "he brings good  money". Patient is requesting to speak with SW to help out with her living situation.

## 2023-10-21 NOTE — Assessment & Plan Note (Addendum)
-  currently in sinus rhythm -not on anticogulation

## 2023-10-21 NOTE — Anesthesia Procedure Notes (Signed)
Procedure Name: Intubation Date/Time: 10/21/2023 3:56 PM  Performed by: Susy Manor, CRNAPre-anesthesia Checklist: Patient identified, Emergency Drugs available, Suction available and Patient being monitored Patient Re-evaluated:Patient Re-evaluated prior to induction Oxygen Delivery Method: Circle System Utilized Preoxygenation: Pre-oxygenation with 100% oxygen Induction Type: IV induction Ventilation: Mask ventilation without difficulty Laryngoscope Size: Mac and 3 Grade View: Grade I Tube type: Oral Tube size: 7.0 mm Number of attempts: 1 Airway Equipment and Method: Stylet and Oral airway Placement Confirmation: ETT inserted through vocal cords under direct vision, positive ETCO2 and breath sounds checked- equal and bilateral Tube secured with: Tape Dental Injury: Teeth and Oropharynx as per pre-operative assessment

## 2023-10-21 NOTE — ED Notes (Signed)
Pt BP elevated Benita Gutter DO notified

## 2023-10-21 NOTE — H&P (Signed)
Plan of care note  72 year old female with left intertroch hip fracture. Plan for left hip cephalomedullary nail.  Risks/Benefits discussed with patient including but not limited to infection, bleeding, damage to surrounding structures, failure of surgery/hardware, need for further surgery and cardiopulmonary complications from anesthesia. She understands and wishes to proceed with surgery  .Luci Bank 10/21/2023, 3:34 PM Orthopaedic Surgery

## 2023-10-21 NOTE — Assessment & Plan Note (Addendum)
-  s/p stents x 2 at [facility] in 2012 but no records -Last echocardiogram in 04/2022 with EF 60 to 65%, moderate LVH, grade 1 diastolic dysfunction, mild to moderate mitral valve regurgitation, moderate to severe tricuspid valve regurgitation -no anginal symptoms  -will need to resume aspirin, statin following hip surgery

## 2023-10-21 NOTE — ED Notes (Signed)
ED TO INPATIENT HANDOFF REPORT  ED Nurse Name and Phone #: Gillis Ends [phone]  S Name/Age/Gender Shannon Burgess 72 y.o. female Room/Bed: 016C/016C  Code Status   Code Status: Full Code  Home/SNF/Other Home Patient oriented to: self, place, time, and situation Is this baseline? Yes   Triage Complete: Triage complete  Chief Complaint Hip fracture [facility]) [S72.009A]  Triage Note Patient with LLE pain after mechanical fall on Friday. Unable to bear weight on same. Denies dizziness or syncope prior to fall. She laid on the floor from 0400 when she fell until 1730 that afternoon d/t inability to get up. Did hit her head but denies headache or syncope.    Allergies No Known Allergies  Level of Care/Admitting Diagnosis ED Disposition     ED Disposition  Admit   Condition  --   Comment  Hospital Area: [facility] [[id number]]  Level of Care: Telemetry Medical [104]  May admit patient to [facility] or [facility] if equivalent level of care is available:: No  Covid Evaluation: Asymptomatic - no recent exposure (last 10 days) testing not required  Diagnosis: Hip fracture [facility]) [[id number]]  Admitting Physician: Anselm Jungling [[id number]]  Attending Physician: Anselm Jungling [[id number]]  Certification:: I certify this patient will need inpatient services for at least 2 midnights  Expected Medical Readiness: 10/23/2023          B Medical/Surgery History Past Medical History:  Diagnosis Date   CAD (coronary artery disease)    Hyperlipidemia    Hypertension    Polysubstance abuse (HCC)    cocaine, heroine, methamphetamine   Tobacco abuse    No past surgical history on file.   A IV Location/Drains/Wounds Patient Lines/Drains/Airways Status     Active Line/Drains/Airways     Name Placement date Placement time Site Days   Peripheral IV 10/20/23 20 G Right Antecubital 10/20/23  1930  Antecubital  1            Intake/Output Last 24 hours No intake or output data in  the 24 hours ending 10/21/23 0004  Labs/Imaging Results for orders placed or performed during the hospital encounter of 10/20/23 (from the past 48 hour(s))  CBC     Status: Abnormal   Collection Time: 10/20/23  6:10 PM  Result Value Ref Range   WBC 8.0 4.0 - 10.5 K/uL   RBC 3.60 (L) 3.87 - 5.11 MIL/uL   Hemoglobin 10.4 (L) 12.0 - 15.0 g/dL   HCT 86.5 (L) 78.4 - 69.6 %   MCV 89.4 80.0 - 100.0 fL   MCH 28.9 26.0 - 34.0 pg   MCHC 32.3 30.0 - 36.0 g/dL   RDW 29.5 28.4 - 13.2 %   Platelets 243 150 - 400 K/uL   nRBC 0.0 0.0 - 0.2 %    Comment: Performed at [facility], [address] [place], [place] [address]  CK     Status: None   Collection Time: 10/20/23  6:10 PM  Result Value Ref Range   Total CK 221 38 - 234 U/L    Comment: Performed at [facility], [address] [place], [place] [address]  Basic metabolic panel     Status: Abnormal   Collection Time: 10/20/23  6:10 PM  Result Value Ref Range   Sodium 139 135 - 145 mmol/L   Potassium 3.0 (L) 3.5 - 5.1 mmol/L   Chloride 103 98 - 111 mmol/L   CO2 26 22 - 32 mmol/L  Glucose, Bld 98 70 - 99 mg/dL    Comment: Glucose reference range applies only to samples taken after fasting for at least 8 hours.   BUN 20 8 - 23 mg/dL   Creatinine, Ser 2.59 0.44 - 1.00 mg/dL   Calcium 8.9 8.9 - 56.3 mg/dL   GFR, Estimated >87 >56 mL/min    Comment: (NOTE) Calculated using the CKD-EPI Creatinine Equation (2021)    Anion gap 10 5 - 15    Comment: Performed at [facility], [address] [place], [place] [address]   DG Chest Port 1 View  Result Date: 10/20/2023 CLINICAL DATA:  Preop EXAM: PORTABLE CHEST 1 VIEW COMPARISON:  Chest x-ray 05/03/2022 FINDINGS: The heart size and mediastinal contours are within normal limits. Both lungs are clear. The visualized skeletal structures are unremarkable. IMPRESSION: No active disease. Electronically Signed   By: Darliss Cheney M.D.   On: 10/20/2023 22:45   DG Pelvis 1-2  Views  Result Date: 10/20/2023 CLINICAL DATA:  Pain after fall. Proximal femoral pain. Mechanical fall on Friday. Unable to bear weight. EXAM: PELVIS - 1-2 VIEW; LEFT FEMUR 2 VIEWS COMPARISON:  None Available. FINDINGS: Fracture of the proximal femur is best appreciated on the lateral femoral view, appears to be intertrochanteric. This cannot be further characterized on the frontal views. No hip dislocation. 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 No pubic symphyseal or sacroiliac diastasis. The distal femur is intact. Normal knee alignment. IMPRESSION: Acute fracture of the proximal femur, only appreciated on the cross-table lateral view. This is likely intertrochanteric, however recommend characterization with CT to further delineate the fracture. Electronically Signed   By: Narda Rutherford M.D.   On: 10/20/2023 17:53   DG FEMUR MIN 2 VIEWS LEFT  Result Date: 10/20/2023 CLINICAL DATA:  Pain after fall. Proximal femoral pain. Mechanical fall on Friday. Unable to bear weight. EXAM: PELVIS - 1-2 VIEW; LEFT FEMUR 2 VIEWS COMPARISON:  None Available. FINDINGS: Fracture of the proximal femur is best appreciated on the lateral femoral view, appears to be intertrochanteric. This cannot be further characterized on the frontal views. No hip dislocation. 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 No pubic symphyseal or sacroiliac diastasis. The distal femur is intact. Normal knee alignment. IMPRESSION: Acute fracture of the proximal femur, only appreciated on the cross-table lateral view. This is likely intertrochanteric, however recommend characterization with CT to further delineate the fracture. Electronically Signed   By: Narda Rutherford M.D.   On: 10/20/2023 17:53    Pending Labs Unresulted Labs (From admission, onward)     Start     Ordered   10/20/23 2356  HIV Antibody (routine testing w rflx)  (HIV Antibody (Routine testing w reflex) panel)  Once,   R        10/20/23 2356             Vitals/Pain Today's Vitals   10/20/23 2214 10/20/23 2215 10/20/23 2231 10/21/23 0000  BP: (!) 161/138   (!) 153/95  Pulse: 90 72  87  Resp: 17 (!) 24  20  Temp: 97.8 F (36.6 C)     TempSrc: Axillary     SpO2: 100% 90%  100%  Weight:      Height:      PainSc:   5      Isolation Precautions No active isolations  Medications Medications  HYDROcodone-acetaminophen (NORCO/VICODIN) 5-325 MG per tablet 1 tablet (has no administration in time range)  morphine (PF) 2 MG/ML injection 0.5 mg (has no  administration in time range)  senna (SENOKOT) tablet 8.6 mg (has no administration in time range)  morphine (PF) 4 MG/ML injection 4 mg (4 mg Intravenous Given 10/20/23 1935)  potassium chloride SA (KLOR-CON M) CR tablet 40 mEq (40 mEq Oral Given 10/20/23 1929)    Mobility non-ambulatory     Focused Assessments     R Recommendations: See Admitting Provider Note  Report given to:   Additional Notes:

## 2023-10-21 NOTE — Anesthesia Preprocedure Evaluation (Addendum)
Anesthesia Evaluation  Patient identified by MRN, date of birth, ID band Patient awake    Reviewed: Allergy & Precautions, NPO status , Patient's Chart, lab work & pertinent test results  History of Anesthesia Complications Negative for: history of anesthetic complications  Airway Mallampati: I  TM Distance: >3 FB Neck ROM: Full    Dental  (+) Edentulous Upper, Edentulous Lower   Pulmonary neg shortness of breath, neg sleep apnea, neg COPD, neg recent URI, Current Smoker (smokes 1/2 ppd)   Pulmonary exam normal breath sounds clear to auscultation       Cardiovascular hypertension, (-) angina + CAD, + Past MI and + Cardiac Stents (x2 06/13/2011)  (-) CABG + dysrhythmias (patient denies) Atrial Fibrillation  Rhythm:Regular Rate:Normal  HLD  TTE 05/03/2022: IMPRESSIONS     1. Left ventricular ejection fraction, by estimation, is 60 to 65%. The  left ventricle has normal function. The left ventricle has no regional  wall motion abnormalities. There is moderate left ventricular hypertrophy.  Left ventricular diastolic  parameters are consistent with Grade I diastolic dysfunction (impaired  relaxation).   2. Right ventricular systolic function is normal. The right ventricular  size is normal. There is moderately elevated pulmonary artery systolic  pressure. The estimated right ventricular systolic pressure is 45.5 mmHg.   3. The mitral valve is normal in structure. Mild to moderate mitral valve  regurgitation. No evidence of mitral stenosis.   4. Tricuspid valve regurgitation is moderate to severe.   5. The aortic valve is tricuspid. There is mild thickening of the aortic  valve. Aortic valve regurgitation is not visualized. No aortic stenosis is  present.   6. The inferior vena cava is dilated in size with <50% respiratory  variability, suggesting right atrial pressure of 15 mmHg.     Neuro/Psych neg Seizures PSYCHIATRIC  DISORDERS  Depression    negative neurological ROS     GI/Hepatic negative GI ROS,,,(+)     substance abuse (no recent use)  cocaine use, methamphetamine use and IV drug use  Endo/Other  negative endocrine ROS    Renal/GU negative Renal ROS     Musculoskeletal   Abdominal   Peds  Hematology negative hematology ROS (+) Lab Results      Component                Value               Date                      WBC                      8.0                 10/20/2023                HGB                      10.4 (L)            10/20/2023                HCT                      32.2 (L)            10/20/2023                MCV  89.4                10/20/2023                PLT                      243                 10/20/2023              Anesthesia Other Findings   Reproductive/Obstetrics                              Anesthesia Physical Anesthesia Plan  ASA: 3  Anesthesia Plan: General   Post-op Pain Management:    Induction: Intravenous  PONV Risk Score and Plan: 2 and Ondansetron, Dexamethasone and Treatment may vary due to age or medical condition  Airway Management Planned: Oral ETT  Additional Equipment:   Intra-op Plan:   Post-operative Plan: Extubation in OR  Informed Consent: I have reviewed the patients History and Physical, chart, labs and discussed the procedure including the risks, benefits and alternatives for the proposed anesthesia with the patient or authorized representative who has indicated his/her understanding and acceptance.     Dental advisory given  Plan Discussed with: CRNA and Anesthesiologist  Anesthesia Plan Comments: (Risks of general anesthesia discussed including, but not limited to, sore throat, hoarse voice, chipped/damaged teeth, injury to vocal cords, nausea and vomiting, allergic reactions, lung infection, heart attack, stroke, and death. All questions answered. )          Anesthesia Quick Evaluation

## 2023-10-21 NOTE — Progress Notes (Signed)
Orthopedic Tech Progress Note Patient Details:  Oaklie Limbaugh 08-10-1951 [id number]  Patient ID: Lonzo Candy, female   DOB: 1951-08-14, 72 y.o.   MRN: [id number] Pt does not meet criteria for ohf. Pt must be under 70 to get ohf. Trinna Post 10/21/2023, 1:00 AM

## 2023-10-21 NOTE — Assessment & Plan Note (Signed)
-  1/2 pack daily -encourage cessation

## 2023-10-21 NOTE — Assessment & Plan Note (Signed)
-  potassium of 3 -replete with oral supplementation

## 2023-10-21 NOTE — Assessment & Plan Note (Signed)
-  Pelvic and femur X-ray acute fracture of the proximal intertrochanteric femur fracture. --Orthopedic/spinal surgery is associated with an intermediate (1-5%) cardiovascular risk for cardiac death and nonfatal MI -With history of MI, the revised cardiac index gives a risk estimate of (6)% -Because of this risk, she is recommended to have pre-operative EKG testing prior to surgery; this was done in the ER -The Detsky's Modified Cardiac Risk Index score is Class (III), with a 20% cardiac risk -Pt is highly functional without anginal symptoms -It is reasonable for the patient to go to the OR without additional evaluation for clearance.

## 2023-10-21 NOTE — Anesthesia Postprocedure Evaluation (Signed)
Anesthesia Post Note  Patient: Shannon Burgess  Procedure(s) Performed: INTRAMEDULLARY (IM) NAIL INTERTROCHANTERIC (Left: Leg Upper)     Patient location during evaluation: PACU Anesthesia Type: General Level of consciousness: awake Pain management: pain level controlled Vital Signs Assessment: post-procedure vital signs reviewed and stable Respiratory status: spontaneous breathing, nonlabored ventilation and respiratory function stable Cardiovascular status: blood pressure returned to baseline and stable Postop Assessment: no apparent nausea or vomiting Anesthetic complications: no   No notable events documented.  Last Vitals:  Vitals:   10/21/23 1823 10/21/23 1931  BP: (!) 180/89 (!) 152/83  Pulse: 76 81  Resp:  18  Temp: 36.7 C 36.9 C  SpO2: 94% 100%    Last Pain:  Vitals:   10/21/23 1931  TempSrc: Oral  PainSc:                  Linton Rump

## 2023-10-21 NOTE — Transfer of Care (Signed)
Immediate Anesthesia Transfer of Care Note  Patient: Lonzo Candy  Procedure(s) Performed: INTRAMEDULLARY (IM) NAIL INTERTROCHANTERIC (Left: Leg Upper)  Patient Location: PACU  Anesthesia Type:General  Level of Consciousness: awake  Airway & Oxygen Therapy: Patient Spontanous Breathing and Patient connected to nasal cannula oxygen  Post-op Assessment: Report given to RN and Post -op Vital signs reviewed and stable  Post vital signs: Reviewed and stable  Last Vitals:  Vitals Value Taken Time  BP 150/96 10/21/23 1708  Temp    Pulse 81 10/21/23 1711  Resp 13 10/21/23 1711  SpO2 100 % 10/21/23 1711  Vitals shown include unfiled device data.  Last Pain:  Vitals:   10/21/23 1426  TempSrc: Oral  PainSc: 7          Complications: No notable events documented.

## 2023-10-21 NOTE — Op Note (Signed)
.  Orthopaedic Surgery Operative Note (CSN: [id number])  Lonzo Candy  [birth year] Date of Surgery: 10/21/2023   Diagnoses:  Left Hip Intertrochanteric Fracture  Procedure: Left hip cephalomedullary nailing   Operative Finding Successful completion of the planned procedure.    Post-operative plan: The patient will be WBAT.  DVT prophylaxis Aspirin 81 mg twice daily for 6 weeks.   Pain control with PRN pain medication preferring oral medicines.  Follow up plan will be scheduled in approximately 14 days for incision check and XR.  Post-Op Diagnosis: Same Surgeons:Primary: Luci Bank, MD Assistants: None Location: [facility] OR ROOM 06 Anesthesia: General Antibiotics: Ancef 2 g Tourniquet time: N/A Estimated Blood Loss: 250cc Complications: None Specimens: None Implants: Implant Name Type Inv. Item Serial No. Manufacturer Lot No. LRB No. Used Action  Short Trochanteric Nail    [employer] [id number] Left 1 Implanted  SCREW TELESCOP LAG 10.5X95 LT - [id number] Screw SCREW TELESCOP LAG 10.5X95 LT  [employer] [id number] Left 1 Implanted  Cortical Screw    [employer] [id number] Left 1 Implanted    Indications for Surgery:   Jamani Crane is a 72 y.o. female with a left hip intertrochanteric fracture after a ground level fall several days ago.  Benefits and risks of operative and nonoperative management were discussed prior to surgery with patient/guardian(s) and informed consent form was completed.  Specific risks including bleeding, infection, need for additional surgery, failure of surgery/hardware, need for assistive devices, dyscosmetic scars and cardiopulmonary complications from anesthesia  Procedure:   The patient was identified properly. Informed consent was obtained and the surgical site was marked. The patient was taken up to suite where general anesthesia was induced.  The patient was positioned supine on the Hana bed.  The left hip was prepped and draped in the usual sterile fashion.   Timeout was performed before the beginning of the case.  The patient was placed supine on a fracture table and appropriate reduction was obtained and visualized on fluoroscopy prior to the beginning of the procedure.  We made an incision proximal to the greater trochanter and dissected down through the fascia. We began by using fluoroscopic guidance to guide the placement of guidewire through the incision passed it down the length of the intramedullary canal past the level of the lesser trochanter. At that point we used an opening reamer to open the femoral canal  We placed our preselected sized nail from [employer]  Once the nail was in the appropriate position on fluoroscopic guidance we were able to place a another percutaneous portal using our outrigger to place a wire for our cephalomedullary screw.  We checked its position on orthogonal imaging before drilling and placing the above size screw.  We had appropriate position of our cephalomedullary screw and we compressed using the instrumentation before activating the [facility] screw mechanism for compression.  We then used the outrigger to place an interlock screw verifying position on fluoroscopy.  Final fluoroscopic images of the hip and femur demonstrated appropriate alignment and position of the instruments.  Incisions were closed in a multilayer fashion after irrigation  We used absorbable sutures and staples. Sterile dressing was placed.  Patient was awoken taken to PACU in stable condition.

## 2023-10-22 ENCOUNTER — Encounter (HOSPITAL_COMMUNITY): Payer: Self-pay | Admitting: Orthopedic Surgery

## 2023-10-22 DIAGNOSIS — S72002D Fracture of unspecified part of neck of left femur, subsequent encounter for closed fracture with routine healing: Secondary | ICD-10-CM | POA: Diagnosis not present

## 2023-10-22 LAB — CBC
HCT: 30.9 % — ABNORMAL LOW (ref 36.0–46.0)
Hemoglobin: 10 g/dL — ABNORMAL LOW (ref 12.0–15.0)
MCH: 28.4 pg (ref 26.0–34.0)
MCHC: 32.4 g/dL (ref 30.0–36.0)
MCV: 87.8 fL (ref 80.0–100.0)
Platelets: 274 10*3/uL (ref 150–400)
RBC: 3.52 MIL/uL — ABNORMAL LOW (ref 3.87–5.11)
RDW: 12.1 % (ref 11.5–15.5)
WBC: 10.2 10*3/uL (ref 4.0–10.5)
nRBC: 0 % (ref 0.0–0.2)

## 2023-10-22 LAB — MAGNESIUM: Magnesium: 1.8 mg/dL (ref 1.7–2.4)

## 2023-10-22 MED ORDER — POTASSIUM CHLORIDE CRYS ER 20 MEQ PO TBCR
40.0000 meq | EXTENDED_RELEASE_TABLET | Freq: Once | ORAL | Status: AC
  Administered 2023-10-22: 40 meq via ORAL
  Filled 2023-10-22: qty 2

## 2023-10-22 MED ORDER — ASPIRIN 81 MG PO TBEC
81.0000 mg | DELAYED_RELEASE_TABLET | Freq: Two times a day (BID) | ORAL | Status: DC
  Administered 2023-10-22 – 2023-10-29 (×15): 81 mg via ORAL
  Filled 2023-10-22 (×15): qty 1

## 2023-10-22 NOTE — Progress Notes (Signed)
PROGRESS NOTE   Marleth Mckendall  ZOX:[id number]    DOB: 12/10/1950    DOA: 10/20/2023  PCP: Pcp, No   I have briefly reviewed patients previous medical records in [facility].  Chief Complaint  Patient presents with   Leg Injury    Brief Hospital Course:  72 y.o. female with medical history significant of HTN, atrial fibrillation not on anticoagulation, CAD s/p stents x2,HLD, MDD, polysubstance abuse, homelessness who presents following mechanical fall at a hotel, was unable to get up for 18 hours, was able to crawl for help.  Had left-sided hip and shoulder pain.  Came to ED.  Pelvic and femur x-ray showed left hip intertrochanteric fracture.  Orthopedic surgery was consulted and patient underwent left hip cephalomedullary nailing on 11/13.   Assessment & Plan:  Principal Problem:   Hip fracture (HCC) Active Problems:   MDD (major depressive disorder)   HTN (hypertension)   History of atrial fibrillation   Hypokalemia   Tobacco use   CAD S/P percutaneous coronary angioplasty    Left hip intertrochanteric fracture -Orthopedics was consulted and she underwent left hip cephalomedullary nailing on 11/13. -Multimodality pain control -PT and OT recommend home health PT and OT.  However only ambulated 6 feet with PT and will not be safe to DC to hotel with that degree of activity.  TOC consulted. -As per orthopedics, appears to be on aspirin 81 Mg twice daily for DVT prophylaxis, WBAT.   CAD S/P percutaneous coronary angioplasty -s/p stents x 2 at [facility] in 2012 but no records -Last echocardiogram in 04/2022 with EF 60 to 65%, moderate LVH, grade 1 diastolic dysfunction, mild to moderate mitral valve regurgitation, moderate to severe tricuspid valve regurgitation -no anginal symptoms  -Patient was supposed to be on aspirin 81 Mg daily, Lipitor 80 mg daily but was not taking it. -Currently on aspirin as noted above, once DVT prophylactic doses completed then she can return to  aspirin 81 Mg daily.   Hypokalemia -Replaced again this morning.  Magnesium 1.8. Follow BMP in AM.   History of atrial fibrillation -Patient is not on anticoagulation PTA.  Also not on any rate control medications.   Hypertension -Continue as needed hydralazine  Chronic anemia: Hemoglobin stable in the 10 g range.  Body mass index is 15.41 kg/m.   DVT prophylaxis: SCDs Start: 10/20/23 2356     Code Status: Full Code:  Family Communication: None at bedside Disposition:  Status is: Inpatient Remains inpatient appropriate because: Immediate postop, ambulated only 6 feet with PT, homeless PTA and lived at a hotel, need to work with PT some more, plan to discuss with [facility] team regarding safe disposition at discharge.     Consultants:   Orthopedics  Procedures:   As noted above  Antimicrobials:      Subjective:  Was up in chair for 10 minutes when I saw her this morning.  Reported "9.5/10 pain".  Stated that she was actually going to rehab.  No other complaints reported.  Objective:   Vitals:   10/22/23 0443 10/22/23 0530 10/22/23 0757 10/22/23 1436  BP: (!) 178/92 (!) 162/80 (!) 146/79 128/68  Pulse: 80  86 86  Resp: 19  18 18   Temp: 98.3 F (36.8 C)  98.5 F (36.9 C) 98 F (36.7 C)  TempSrc: Oral  Oral Oral  SpO2:   100% 98%  Weight:      Height:        General exam: Elderly female, moderately built,  thinly nourished sitting up comfortably in chair and did not appear in any distress. Respiratory system: Clear to auscultation. Respiratory effort normal. Cardiovascular system: S1 & S2 heard, RRR. No JVD, murmurs, rubs, gallops or clicks. No pedal edema. Gastrointestinal system: Abdomen is nondistended, soft and nontender. No organomegaly or masses felt. Normal bowel sounds heard. Central nervous system: Alert and oriented. No focal neurological deficits. Extremities: Symmetric 5 x 5 power.  Left hip surgical site dressing clean and dry. Skin: No rashes,  lesions or ulcers Psychiatry: Judgement and insight appear normal. Mood & affect appropriate.     Data Reviewed:   I have personally reviewed following labs and imaging studies   CBC: Recent Labs  Lab 10/20/23 1810 10/22/23 0601  WBC 8.0 10.2  HGB 10.4* 10.0*  HCT 32.2* 30.9*  MCV 89.4 87.8  PLT 243 274    Basic Metabolic Panel: Recent Labs  Lab 10/20/23 1810 10/21/23 1906 10/22/23 0610  NA 139 134*  --   K 3.0* 3.2*  --   CL 103 101  --   CO2 26 23  --   GLUCOSE 98 109*  --   BUN 20 13  --   CREATININE 0.58 0.46  --   CALCIUM 8.9 8.7*  --   MG  --   --  1.8    Liver Function Tests: No results for input(s): "AST", "ALT", "ALKPHOS", "BILITOT", "PROT", "ALBUMIN" in the last 168 hours.  CBG: No results for input(s): "GLUCAP" in the last 168 hours.  Microbiology Studies:   Recent Results (from the past 240 hour(s))  Surgical pcr screen     Status: Abnormal   Collection Time: 10/21/23  8:04 AM   Specimen: Nasal Mucosa; Nasal Swab  Result Value Ref Range Status   MRSA, PCR NEGATIVE NEGATIVE Final   Staphylococcus aureus POSITIVE (A) NEGATIVE Final    Comment: (NOTE) The Xpert SA Assay (FDA approved for NASAL specimens in patients 50 years of age and older), is one component of a comprehensive surveillance program. It is not intended to diagnose infection nor to guide or monitor treatment. Performed at [facility], [address] [place], [place] [address]     Radiology Studies:  DG FEMUR MIN 2 VIEWS LEFT  Result Date: 10/21/2023 CLINICAL DATA:  Hip surgery EXAM: LEFT FEMUR 2 VIEWS COMPARISON:  10/20/2023 FINDINGS: Six low resolution intraoperative spot views of the left hip/femur. Total fluoroscopy time was 1 minutes 5 seconds, fluoroscopic dose of 7.61 mGy. The images demonstrate intramedullary rodding and screw fixation of proximal left femoral fracture. IMPRESSION: Intraoperative fluoroscopic assistance provided during left femoral fracture  fixation. Electronically Signed   By: Jasmine Pang M.D.   On: 10/21/2023 21:18   DG C-Arm 1-60 Min-No Report  Result Date: 10/21/2023 Fluoroscopy was utilized by the requesting physician.  No radiographic interpretation.   DG Chest Port 1 View  Result Date: 10/20/2023 CLINICAL DATA:  Preop EXAM: PORTABLE CHEST 1 VIEW COMPARISON:  Chest x-ray 05/03/2022 FINDINGS: The heart size and mediastinal contours are within normal limits. Both lungs are clear. The visualized skeletal structures are unremarkable. IMPRESSION: No active disease. Electronically Signed   By: Darliss Cheney M.D.   On: 10/20/2023 22:45   DG Pelvis 1-2 Views  Result Date: 10/20/2023 CLINICAL DATA:  Pain after fall. Proximal femoral pain. Mechanical fall on Friday. Unable to bear weight. EXAM: PELVIS - 1-2 VIEW; LEFT FEMUR 2 VIEWS COMPARISON:  None Available. FINDINGS: Fracture of the proximal femur is best  appreciated on the lateral femoral view, appears to be intertrochanteric. This cannot be further characterized on the frontal views. No hip dislocation. 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 No pubic symphyseal or sacroiliac diastasis. The distal femur is intact. Normal knee alignment. IMPRESSION: Acute fracture of the proximal femur, only appreciated on the cross-table lateral view. This is likely intertrochanteric, however recommend characterization with CT to further delineate the fracture. Electronically Signed   By: Narda Rutherford M.D.   On: 10/20/2023 17:53   DG FEMUR MIN 2 VIEWS LEFT  Result Date: 10/20/2023 CLINICAL DATA:  Pain after fall. Proximal femoral pain. Mechanical fall on Friday. Unable to bear weight. EXAM: PELVIS - 1-2 VIEW; LEFT FEMUR 2 VIEWS COMPARISON:  None Available. FINDINGS: Fracture of the proximal femur is best appreciated on the lateral femoral view, appears to be intertrochanteric. This cannot be further characterized on the frontal views. No hip dislocation. 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  No pubic symphyseal or sacroiliac diastasis. The distal femur is intact. Normal knee alignment. IMPRESSION: Acute fracture of the proximal femur, only appreciated on the cross-table lateral view. This is likely intertrochanteric, however recommend characterization with CT to further delineate the fracture. Electronically Signed   By: Narda Rutherford M.D.   On: 10/20/2023 17:53    Scheduled Meds:    aspirin EC  81 mg Oral BID   mupirocin ointment   Nasal BID   senna  1 tablet Oral BID    Continuous Infusions:     ceFAZolin (ANCEF) IV 1 g (10/22/23 1419)     LOS: 2 days     Marcellus Scott, MD,  FACP, [facility], [facility], [facility]   [facility] [place]      To contact the attending provider between 7A-7P or the covering provider during after hours 7P-7A, please log into the web site [email or web address] and access using universal [place] password for that web site. If you do not have the password, please call the hospital operator.  10/22/2023, 3:23 PM

## 2023-10-22 NOTE — Plan of Care (Signed)
  Problem: Education: Goal: Knowledge of General Education information will improve Description: Including pain rating scale, medication(s)/side effects and non-pharmacologic comfort measures Outcome: Progressing   Problem: Health Behavior/Discharge Planning: Goal: Ability to manage health-related needs will improve Outcome: Progressing   Problem: Clinical Measurements: Goal: Ability to maintain clinical measurements within normal limits will improve Outcome: Progressing   Problem: Nutrition: Goal: Adequate nutrition will be maintained Outcome: Progressing   Problem: Pain Management: Goal: General experience of comfort will improve Outcome: Progressing

## 2023-10-22 NOTE — Evaluation (Signed)
Occupational Therapy Evaluation Patient Details Name: Shannon Burgess MRN: [id number] DOB: 27-Sep-1951 Today's Date: 10/22/2023   History of Present Illness 72 y/o female presents to [facility] 10/20/23 after tripping and falling on L side. Pt w/ L intertrochanteric hip fx, s/p L IM nail 11/12. PMHx: polysubstance abuse (tobacco, cocaine, methamphetamine, heroin), CAD s/p percutaneous coronary angioplasty, HTN, HLD, depression, and anxiety with panic attacks.   Clinical Impression   PTA patient independent with mobility using SPC, ADLs. Admitted for above and presents with problem list below. She reports she is unsure of her dc location, as she has been staying in a motel; but will have support of her son and son's girlfriend at dc.  Today, she requires min assist for transfers and short distance with 2 person hand held assist, requires setup to mod assist for ADLs.  Anticipate she will progress well with recommendations with HHOT at dc to [place] independence and safety. Will follow acutely.       If plan is discharge home, recommend the following: A lot of help with bathing/dressing/bathroom;A little help with walking and/or transfers;Assist for transportation;Help with stairs or ramp for entrance;Assistance with cooking/housework    Functional Status Assessment  Patient has had a recent decline in their functional status and demonstrates the ability to make significant improvements in function in a reasonable and predictable amount of time.  Equipment Recommendations  BSC/3in1;Other (comment) (RW)    Recommendations for Other Services       Precautions / Restrictions Precautions Precautions: Fall Restrictions Weight Bearing Restrictions: Yes LLE Weight Bearing: Weight bearing as tolerated      Mobility Bed Mobility               General bed mobility comments: EOB upon entry with PT    Transfers Overall transfer level: Needs assistance Equipment used: 2 person hand held  assist Transfers: Sit to/from Stand Sit to Stand: Min assist           General transfer comment: MinA w/ 2 HH for initial rise, cues to bring L LE under hips once in standing to fully WB      Balance Overall balance assessment: Needs assistance Sitting-balance support: No upper extremity supported, Feet supported Sitting balance-Leahy Scale: Good     Standing balance support: Bilateral upper extremity supported, During functional activity, Reliant on assistive device for balance Standing balance-Leahy Scale: Poor Standing balance comment: reliant on UE support                           ADL either performed or assessed with clinical judgement   ADL Overall ADL's : Needs assistance/impaired     Grooming: Set up;Sitting           Upper Body Dressing : Set up;Sitting   Lower Body Dressing: Moderate assistance;Sit to/from stand   Toilet Transfer: Minimal assistance;Ambulation Toilet Transfer Details (indicate cue type and reason): no RW present, 2 person HHA         Functional mobility during ADLs: Minimal assistance       Vision   Vision Assessment?: No apparent visual deficits     Perception         Praxis         Pertinent Vitals/Pain Pain Assessment Pain Assessment: Faces Faces Pain Scale: Hurts even more Pain Location: L LE Pain Descriptors / Indicators: Aching, Discomfort, Grimacing Pain Intervention(s): Limited activity within patient's tolerance, Monitored during session, Repositioned     Extremity/Trunk  Assessment Upper Extremity Assessment Upper Extremity Assessment: Overall WFL for tasks assessed   Lower Extremity Assessment Lower Extremity Assessment: Defer to PT evaluation   Cervical / Trunk Assessment Cervical / Trunk Assessment: Normal   Communication Communication Communication: No apparent difficulties   Cognition Arousal: Alert Behavior During Therapy: WFL for tasks assessed/performed Overall Cognitive Status:  Within Functional Limits for tasks assessed                                       General Comments       Exercises     Shoulder Instructions      Home Living Family/patient expects to be discharged to:: Shelter/Homeless                                 Additional Comments: pt is looking into shelters, will stay with son who can provide physical assistance      Prior Functioning/Environment Prior Level of Function : Independent/Modified Independent             Mobility Comments: uses SP cane, 2-3 falls in the past 6 months. ADLs Comments: Independent w/ ADLs        OT Problem List: Decreased strength;Decreased activity tolerance;Impaired balance (sitting and/or standing);Decreased knowledge of use of DME or AE;Decreased knowledge of precautions;Pain      OT Treatment/Interventions:      OT Goals(Current goals can be found in the care plan section) Acute Rehab OT Goals Patient Stated Goal: get better OT Goal Formulation: With patient Time For Goal Achievement: 11/05/23 Potential to Achieve Goals: Good  OT Frequency: Min 1X/week    Co-evaluation              AM-PAC OT "6 Clicks" Daily Activity     Outcome Measure Help from another person eating meals?: None Help from another person taking care of personal grooming?: A Little Help from another person toileting, which includes using toliet, bedpan, or urinal?: A Little Help from another person bathing (including washing, rinsing, drying)?: A Lot Help from another person to put on and taking off regular upper body clothing?: A Little Help from another person to put on and taking off regular lower body clothing?: A Lot 6 Click Score: 17   End of Session Equipment Utilized During Treatment: Gait belt Nurse Communication: Mobility status  Activity Tolerance: Patient tolerated treatment well Patient left: in chair;with call bell/phone within reach;with chair alarm set  OT Visit  Diagnosis: Other abnormalities of gait and mobility (R26.89);Muscle weakness (generalized) (M62.81);Pain;History of falling (Z91.81) Pain - Right/Left: Left Pain - part of body: Hip                Time: 2725-3664 OT Time Calculation (min): 18 min Charges:  OT General Charges $OT Visit: 1 Visit OT Evaluation $OT Eval Moderate Complexity: 1 Mod  Barry Brunner, OT Acute Rehabilitation Services Office [phone]   Chancy Milroy 10/22/2023, 1:35 PM

## 2023-10-22 NOTE — Evaluation (Signed)
Physical Therapy Evaluation Patient Details Name: Shannon Burgess MRN: [id number] DOB: 1950-12-25 Today's Date: 10/22/2023  History of Present Illness  72 y/o female presents to [facility] 10/20/23 after tripping and falling on L side. Pt w/ L intertrochanteric hip fx, s/p L IM nail 11/12. PMHx: polysubstance abuse (tobacco, cocaine, methamphetamine, heroin), CAD s/p percutaneous coronary angioplasty, HTN, HLD, depression, and anxiety with panic attacks.   Clinical Impression  Pt in bed upon arrival and agreeable to PT eval. Prior to admission, pt was independent with mobility with a SP cane. Pt was able to stand with [facility] and MinA and ambulate a short distance to the recliner. Pt presents to therapy session with decreased LE strength, ROM, balance, activity tolerance, and mobility. Pt will have 24/7 support from son upon discharge, however, pt is unsure of discharge location as they are currently homeless. Pt would benefit from acute skilled PT to address functional impairments. Recommending post-acute HHPT to work on regaining independence with mobility. Acute PT to follow.          If plan is discharge home, recommend the following: A little help with walking and/or transfers;A little help with bathing/dressing/bathroom   Can travel by private vehicle    Yes    Equipment Recommendations None recommended by PT (will continue to assess, need to ask if RW at home is in good condition)     Functional Status Assessment Patient has had a recent decline in their functional status and demonstrates the ability to make significant improvements in function in a reasonable and predictable amount of time.     Precautions / Restrictions Precautions Precautions: Fall Restrictions Weight Bearing Restrictions: Yes LLE Weight Bearing: Weight bearing as tolerated      Mobility  Bed Mobility Overal bed mobility: Needs Assistance Bed Mobility: Supine to Sit     Supine to sit: Min assist     General bed  mobility comments: MinA to assist with moving L LE towards EOB, segmented movements with rest breaks in between due to pain and fatigue    Transfers Overall transfer level: Needs assistance Equipment used: 2 person hand held assist Transfers: Sit to/from Stand Sit to Stand: Min assist    General transfer comment: MinA w/ 2 HH for initial rise, cues to bring L LE under hips once in standing to fully WB    Ambulation/Gait Ambulation/Gait assistance: Min assist Gait Distance (Feet): 6 Feet Assistive device: 2 person hand held assist Gait Pattern/deviations: Step-to pattern, Shuffle Gait velocity: dec     General Gait Details: shortened steps with standing rest breaks, MinA for [facility] as pt uses UE to support self while taking steps       Balance Overall balance assessment: Needs assistance Sitting-balance support: No upper extremity supported, Feet supported Sitting balance-Leahy Scale: Good     Standing balance support: Bilateral upper extremity supported, During functional activity, Reliant on assistive device for balance Standing balance-Leahy Scale: Poor Standing balance comment: reliant on UE support          Pertinent Vitals/Pain Pain Assessment Pain Assessment: Faces Faces Pain Scale: Hurts even more Pain Location: L LE Pain Descriptors / Indicators: Aching, Discomfort, Grimacing Pain Intervention(s): Limited activity within patient's tolerance, Monitored during session, Repositioned    Home Living Family/patient expects to be discharged to:: Shelter/Homeless    Additional Comments: pt is looking into shelters, will stay with son who can provide physical assistance    Prior Function Prior Level of Function : Independent/Modified Independent  Mobility Comments: uses SP cane, 2-3 falls in the past 6 months. ADLs Comments: Independent w/ ADLs     Extremity/Trunk Assessment   Upper Extremity Assessment Upper Extremity Assessment: Defer to OT evaluation     Lower Extremity Assessment Lower Extremity Assessment: Generalized weakness (limited L knee ext, flex, and hip flexion ROM due to pain in L hip)    Cervical / Trunk Assessment Cervical / Trunk Assessment: Normal  Communication   Communication Communication: No apparent difficulties  Cognition Arousal: Alert Behavior During Therapy: WFL for tasks assessed/performed Overall Cognitive Status: Within Functional Limits for tasks assessed     General Comments General comments (skin integrity, edema, etc.): VSS on RA     PT Assessment Patient needs continued PT services  PT Problem List Decreased strength;Decreased range of motion;Decreased activity tolerance;Decreased balance;Decreased mobility       PT Treatment Interventions DME instruction;Gait training;Functional mobility training;Therapeutic activities;Therapeutic exercise;Balance training;Neuromuscular re-education;Patient/family education    PT Goals (Current goals can be found in the Care Plan section)  Acute Rehab PT Goals Patient Stated Goal: to get better PT Goal Formulation: With patient Time For Goal Achievement: 11/05/23 Potential to Achieve Goals: Good    Frequency Min 1X/week        AM-PAC PT "6 Clicks" Mobility  Outcome Measure Help needed turning from your back to your side while in a flat bed without using bedrails?: A Little Help needed moving from lying on your back to sitting on the side of a flat bed without using bedrails?: A Little Help needed moving to and from a bed to a chair (including a wheelchair)?: A Little Help needed standing up from a chair using your arms (e.g., wheelchair or bedside chair)?: A Little Help needed to walk in hospital room?: A Little Help needed climbing 3-5 steps with a railing? : A Lot 6 Click Score: 17    End of Session Equipment Utilized During Treatment: Gait belt Activity Tolerance: Patient tolerated treatment well Patient left: in chair;with call bell/phone  within reach Nurse Communication: Mobility status PT Visit Diagnosis: Unsteadiness on feet (R26.81);Repeated falls (R29.6);Muscle weakness (generalized) (M62.81)    Time: 7829-5621 PT Time Calculation (min) (ACUTE ONLY): 18 min   Charges:   PT Evaluation $PT Eval Low Complexity: 1 Low   PT General Charges $$ ACUTE PT VISIT: 1 Visit         Hilton Cork, PT, DPT Secure Chat Preferred  Rehab Office [phone]   Arturo Morton Brion Aliment 10/22/2023, 12:38 PM

## 2023-10-22 NOTE — Progress Notes (Signed)
.  Subjective: 1 Day Post-Op Procedure(s) (LRB): INTRAMEDULLARY (IM) NAIL INTERTROCHANTERIC (Left)  Patient seen and examined bedside. She was able to get to chair and walk a few steps with PT. States pain was manageable but is worse now after PT  Activity level:  WBAT Diet tolerance:  as tolerated Patient reports pain as moderate.    Objective: Vital signs in last 24 hours: Temp:  [98 F (36.7 C)-98.5 F (36.9 C)] 98 F (36.7 C) (11/13 1436) Pulse Rate:  [80-86] 86 (11/13 1436) Resp:  [18-19] 18 (11/13 1436) BP: (128-178)/(68-92) 128/68 (11/13 1436) SpO2:  [98 %-100 %] 98 % (11/13 1436)  Labs: Recent Labs    10/20/23 1810 10/22/23 0601  HGB 10.4* 10.0*   Recent Labs    10/20/23 1810 10/22/23 0601  WBC 8.0 10.2  RBC 3.60* 3.52*  HCT 32.2* 30.9*  PLT 243 274   Recent Labs    10/20/23 1810 10/21/23 1906  NA 139 134*  K 3.0* 3.2*  CL 103 101  CO2 26 23  BUN 20 13  CREATININE 0.58 0.46  GLUCOSE 98 109*  CALCIUM 8.9 8.7*   No results for input(s): "LABPT", "INR" in the last 72 hours.  Physical Exam:  Neurologically intact Neurovascular intact Sensation intact distally Intact pulses distally Dorsiflexion/Plantar flexion intact Incision: dressing C/D/I  Assessment/Plan:  1 Day Post-Op Procedure(s) (LRB): INTRAMEDULLARY (IM) NAIL INTERTROCHANTERIC (Left)  WBAT Continue PT/OT Patient is homeless and would benefit from [facility] consult Multimodal pain control Aspirin 81mg  BID for 6 weeks for DVT ppx  .Luci Bank 10/22/2023, 8:15 PM Orthopaedic Surgery     Luci Bank 10/22/2023, 8:11 PM

## 2023-10-23 DIAGNOSIS — S72002D Fracture of unspecified part of neck of left femur, subsequent encounter for closed fracture with routine healing: Secondary | ICD-10-CM | POA: Diagnosis not present

## 2023-10-23 LAB — BASIC METABOLIC PANEL
Anion gap: 8 (ref 5–15)
BUN: 13 mg/dL (ref 8–23)
CO2: 24 mmol/L (ref 22–32)
Calcium: 8.7 mg/dL — ABNORMAL LOW (ref 8.9–10.3)
Chloride: 98 mmol/L (ref 98–111)
Creatinine, Ser: 0.81 mg/dL (ref 0.44–1.00)
GFR, Estimated: >60 mL/min (ref >60)
Glucose, Bld: 102 mg/dL — ABNORMAL HIGH (ref 70–99)
Potassium: 3.9 mmol/L (ref 3.5–5.1)
Sodium: 130 mmol/L — ABNORMAL LOW (ref 135–145)

## 2023-10-23 LAB — CBC
HCT: 29.8 % — ABNORMAL LOW (ref 36.0–46.0)
Hemoglobin: 9.8 g/dL — ABNORMAL LOW (ref 12.0–15.0)
MCH: 28.7 pg (ref 26.0–34.0)
MCHC: 32.9 g/dL (ref 30.0–36.0)
MCV: 87.4 fL (ref 80.0–100.0)
Platelets: 258 10*3/uL (ref 150–400)
RBC: 3.41 MIL/uL — ABNORMAL LOW (ref 3.87–5.11)
RDW: 12.3 % (ref 11.5–15.5)
WBC: 7.9 10*3/uL (ref 4.0–10.5)
nRBC: 0 % (ref 0.0–0.2)

## 2023-10-23 NOTE — Care Management Important Message (Signed)
Important Message  Patient Details  Name: Shannon Burgess MRN: [id number] Date of Birth: 25-Feb-1951   Important Message Given:  Yes - Medicare IM     Sherilyn Banker 10/23/2023, 3:15 PM

## 2023-10-23 NOTE — Progress Notes (Signed)
Physical Therapy Treatment Patient Details Name: Shannon Burgess MRN: [id number] DOB: August 27, 1951 Today's Date: 10/23/2023   History of Present Illness 72 y/o female presents to [facility] 10/20/23 after tripping and falling on L side. Pt w/ L intertrochanteric hip fx, s/p L IM nail 11/12. PMHx: polysubstance abuse (tobacco, cocaine, methamphetamine, heroin), CAD s/p percutaneous coronary angioplasty, HTN, HLD, depression, and anxiety with panic attacks.    PT Comments  Pt in bed upon arrival and agreeable to PT session. Pt improved in today's session with being able to ambulate further with less assistance. Pt ambulated ~60 feet with a RW and supervision. Pt continues to have pain activating L hip flexor and quad musculature for bed mobility and LE exercises. Pt is progressing well towards goals. Acute PT to follow.      If plan is discharge home, recommend the following: A little help with walking and/or transfers;A little help with bathing/dressing/bathroom   Can travel by private vehicle      Yes  Equipment Recommendations  Rolling walker (2 wheels)       Precautions / Restrictions Precautions Precautions: Fall Restrictions Weight Bearing Restrictions: Yes LLE Weight Bearing: Weight bearing as tolerated     Mobility  Bed Mobility Overal bed mobility: Needs Assistance Bed Mobility: Supine to Sit, Sit to Supine     Supine to sit: Supervision Sit to supine: Min assist   General bed mobility comments: MinA for sit/supine for LE management    Transfers Overall transfer level: Needs assistance Equipment used: Rolling walker (2 wheels) Transfers: Sit to/from Stand Sit to Stand: Contact guard assist           General transfer comment: CGA for safety, cues for proper hand placement    Ambulation/Gait Ambulation/Gait assistance: Supervision Gait Distance (Feet): 60 Feet Assistive device: Rolling walker (2 wheels) Gait Pattern/deviations: Step-to pattern, Shuffle, Decreased  step length - right, Decreased stance time - right Gait velocity: dec     General Gait Details: slow and steady gait w/ decreased WB on L LE. Heavy UE usage with RW during R swing phase         Balance Overall balance assessment: Needs assistance Sitting-balance support: No upper extremity supported, Feet supported       Standing balance support: Bilateral upper extremity supported, During functional activity Standing balance-Leahy Scale: Fair Standing balance comment: reliant on UE support         Cognition Arousal: Alert Behavior During Therapy: WFL for tasks assessed/performed Overall Cognitive Status: Within Functional Limits for tasks assessed         Exercises General Exercises - Lower Extremity Long Arc Quad: AROM, Both, 10 reps, Seated Hip Flexion/Marching: AROM, Left, Right, 5 reps, 10 reps, Seated (x10 R LE, x5 L LE)    General Comments General comments (skin integrity, edema, etc.): VSS on RA      Pertinent Vitals/Pain Pain Assessment Pain Assessment: Faces Faces Pain Scale: Hurts even more Pain Location: L LE Pain Descriptors / Indicators: Aching, Discomfort, Grimacing Pain Intervention(s): Limited activity within patient's tolerance, Monitored during session, Repositioned, Patient requesting pain meds-RN notified     PT Goals (current goals can now be found in the care plan section) Acute Rehab PT Goals PT Goal Formulation: With patient Time For Goal Achievement: 11/05/23 Potential to Achieve Goals: Good Progress towards PT goals: Progressing toward goals    Frequency    Min 1X/week       AM-PAC PT "6 Clicks" Mobility   Outcome Measure  Help needed  turning from your back to your side while in a flat bed without using bedrails?: None Help needed moving from lying on your back to sitting on the side of a flat bed without using bedrails?: A Little Help needed moving to and from a bed to a chair (including a wheelchair)?: A Little Help  needed standing up from a chair using your arms (e.g., wheelchair or bedside chair)?: A Little Help needed to walk in hospital room?: A Little Help needed climbing 3-5 steps with a railing? : A Lot 6 Click Score: 18    End of Session Equipment Utilized During Treatment: Gait belt Activity Tolerance: Patient tolerated treatment well   Nurse Communication: Mobility status;Patient requests pain meds PT Visit Diagnosis: Unsteadiness on feet (R26.81);Repeated falls (R29.6);Muscle weakness (generalized) (M62.81)     Time: 3716-9678 PT Time Calculation (min) (ACUTE ONLY): 17 min  Charges:    $Gait Training: 8-22 mins PT General Charges $$ ACUTE PT VISIT: 1 Visit                    Hilton Cork, PT, DPT Secure Chat Preferred  Rehab Office [phone]    Arturo Morton Brion Aliment 10/23/2023, 4:13 PM

## 2023-10-23 NOTE — TOC Initial Note (Signed)
Transition of Care [facility]) - Initial/Assessment Note    Patient Details  Name: Shannon Burgess MRN: [id number] Date of Birth: 25-Jun-1951  [facility] CM/SW Contact:    Lorri Frederick, LCSW Phone Number: 10/23/2023, 3:40 PM  Clinical Narrative: CSW met with pt for initial assessment.  Discussed PT recommendations for HH--today PT note not yet in, but pt reports she was able to walk better today than yesterday.  Pt reports she lost her housing earlier this year.  She and her son have been staying in a hotel since then.  SHe is out of money currently, they are unable to return to the hotel they were at, but her son is finding some work and told her yesterday he had found another hotel room.    CSW discussed SDOH: food, housing, transportation.   Food: pt reports this has not been a problem, between she and her son's income and another person who stays with them who has food stamps.  Discussed food pantries and she has used them in the past.  Housing: pt does receive social security income of $2128 on 12/3.  She was paying $1000 per month rent at her prior house and had enough to live on.  She does want to work towards returning to permanent housing.  Transportation: this is a significant need.  Pt does not qualify for medicaid.  Her son mostly walks to get where he needs to go.  She has missed multiple MD appts due to not having transportation.    TOC awaiting new PT note to continue DC planning.                    Expected Discharge Plan: Home w Home Health Services Barriers to Discharge: Continued Medical Work up   Patient Goals and CMS Choice Patient states their goals for this hospitalization and ongoing recovery are:: do what I was doing before          Expected Discharge Plan and Services In-house Referral: Clinical Social Work     Living arrangements for the past 2 months: Hotel/Motel                                      Prior Living  Arrangements/Services Living arrangements for the past 2 months: Hotel/Motel Lives with:: Adult Children (son [name]) Patient language and need for interpreter reviewed:: Yes Do you feel safe going back to the place where you live?: Yes      Need for Family Participation in Patient Care: Yes (Comment) Care giver support system in place?: Yes (comment) Current home services: Other (comment) (none) Criminal Activity/Legal Involvement Pertinent to Current Situation/Hospitalization: No - Comment as needed  Activities of Daily Living   ADL Screening (condition at time of admission) Independently performs ADLs?: No Does the patient have a NEW difficulty with bathing/dressing/toileting/self-feeding that is expected to last >3 days?: No Does the patient have a NEW difficulty with getting in/out of bed, walking, or climbing stairs that is expected to last >3 days?: No Does the patient have a NEW difficulty with communication that is expected to last >3 days?: No Is the patient deaf or have difficulty hearing?: Yes Does the patient have difficulty seeing, even when wearing glasses/contacts?: No Does the patient have difficulty concentrating, remembering, or making decisions?: Yes  Permission Sought/Granted Permission sought to share information with : Family Supports Permission granted to share  information with : Yes, Verbal Permission Granted  Share Information with NAME: son [name]           Emotional Assessment Appearance:: Appears stated age Attitude/Demeanor/Rapport: Engaged Affect (typically observed): Afraid/Fearful, Appropriate Orientation: : Oriented to Self, Oriented to Place, Oriented to  Time, Oriented to Situation      Admission diagnosis:  Hip fracture (HCC) [S72.009A] Closed fracture of left hip, initial encounter (HCC) [S72.002A] Patient Active Problem List   Diagnosis Date Noted   HTN (hypertension) 10/21/2023   History of atrial fibrillation 10/21/2023   Hypokalemia  10/21/2023   Tobacco use 10/21/2023   CAD S/P percutaneous coronary angioplasty 10/21/2023   Hip fracture (HCC) 10/20/2023   Atrial fibrillation with RVR (HCC) 05/03/2022   Troponin level elevated    Cocaine abuse (HCC)    Methamphetamine abuse (HCC)    MDD (major depressive disorder) 04/27/2022   PCP:  Pcp, No Pharmacy:   [facility] [address] [place] [address] Phone: [phone] Fax: [phone]  [facility] [phone] [place], [place] - [address] AT [facility] [address] [place] [address] Phone: [phone] Fax: [phone]     Social Determinants of Health (SDOH) Social History: SDOH Screenings   Food Insecurity: Food Insecurity Present (10/21/2023)  Housing: High Risk (10/21/2023)  Transportation Needs: Unmet Transportation Needs (10/21/2023)  Utilities: At Risk (10/21/2023)  Depression (PHQ2-9): High Risk (04/28/2022)  Tobacco Use: High Risk (10/21/2023)   SDOH Interventions:     Readmission Risk Interventions     No data to display

## 2023-10-23 NOTE — Plan of Care (Signed)
  Problem: Education: Goal: Knowledge of General Education information will improve Description: Including pain rating scale, medication(s)/side effects and non-pharmacologic comfort measures Outcome: Progressing   Problem: Health Behavior/Discharge Planning: Goal: Ability to manage health-related needs will improve Outcome: Progressing   Problem: Clinical Measurements: Goal: Ability to maintain clinical measurements within normal limits will improve Outcome: Progressing Goal: Will remain free from infection Outcome: Progressing Goal: Diagnostic test results will improve Outcome: Progressing   Problem: Activity: Goal: Risk for activity intolerance will decrease Outcome: Progressing   Problem: Nutrition: Goal: Adequate nutrition will be maintained Outcome: Progressing   Problem: Coping: Goal: Level of anxiety will decrease Outcome: Progressing   Problem: Elimination: Goal: Will not experience complications related to bowel motility Outcome: Progressing   Problem: Pain Management: Goal: General experience of comfort will improve Outcome: Progressing   Problem: Safety: Goal: Ability to remain free from injury will improve Outcome: Progressing

## 2023-10-23 NOTE — Plan of Care (Signed)
  Problem: Clinical Measurements: Goal: Will remain free from infection Outcome: Progressing   Problem: Activity: Goal: Risk for activity intolerance will decrease Outcome: Progressing   Problem: Nutrition: Goal: Adequate nutrition will be maintained Outcome: Progressing   Problem: Coping: Goal: Level of anxiety will decrease Outcome: Progressing   Problem: Elimination: Goal: Will not experience complications related to bowel motility Outcome: Progressing Goal: Will not experience complications related to urinary retention Outcome: Progressing   Problem: Pain Management: Goal: General experience of comfort will improve Outcome: Progressing

## 2023-10-23 NOTE — Progress Notes (Signed)
Occupational Therapy Treatment Patient Details Name: Shannon Burgess MRN: [id number] DOB: Nov 02, 1951 Today's Date: 10/23/2023   History of present illness 72 y/o female presents to [facility] 10/20/23 after tripping and falling on L side. Pt w/ L intertrochanteric hip fx, s/p L IM nail 11/12. PMHx: polysubstance abuse (tobacco, cocaine, methamphetamine, heroin), CAD s/p percutaneous coronary angioplasty, HTN, HLD, depression, and anxiety with panic attacks.   OT comments  Pt agreeable to OT, reports pain in L hip but improved compared to yesterday.  Completing bed mobility with min assist, transfer with min guard using RW, LB dressing with mod assist and toileting with min guard. Completed functional mobility in room to [facility] and then to recliner.  Intermittent cueing for safety with hand placement and RW mgmt. Pt reports she will have help at dc for ADLs, but also mentions she does not know where she is going to live. Will follow acutely, continue to recommend HHOT services at dc.        If plan is discharge home, recommend the following:  A little help with walking and/or transfers;Assist for transportation;Help with stairs or ramp for entrance;Assistance with cooking/housework;A lot of help with bathing/dressing/bathroom   Equipment Recommendations  BSC/3in1;Other (comment) (RW)    Recommendations for Other Services      Precautions / Restrictions Precautions Precautions: Fall Restrictions Weight Bearing Restrictions: Yes LLE Weight Bearing: Weight bearing as tolerated       Mobility Bed Mobility Overal bed mobility: Needs Assistance Bed Mobility: Supine to Sit     Supine to sit: Min assist     General bed mobility comments: min assist for guiding L LE towards EOB    Transfers Overall transfer level: Needs assistance Equipment used: Rolling walker (2 wheels) Transfers: Sit to/from Stand Sit to Stand: Contact guard assist           General transfer comment: cueing for hand  placement and safety, increased time but no physical assist required to power up     Balance Overall balance assessment: Needs assistance Sitting-balance support: No upper extremity supported, Feet supported       Standing balance support: Bilateral upper extremity supported, During functional activity Standing balance-Leahy Scale: Fair Standing balance comment: reliant on UE support                           ADL either performed or assessed with clinical judgement   ADL Overall ADL's : Needs assistance/impaired     Grooming: Set up;Sitting               Lower Body Dressing: Moderate assistance;Sit to/from stand Lower Body Dressing Details (indicate cue type and reason): assist for L sock, min guard in standing but relies on at least 1 UE support Toilet Transfer: Contact guard assist;Ambulation;Rolling walker (2 wheels) Toilet Transfer Details (indicate cue type and reason): to [facility] Toileting- Clothing Manipulation and Hygiene: Contact guard assist;Sit to/from stand;Sitting/lateral lean       Functional mobility during ADLs: Contact guard assist;Rolling walker (2 wheels)      Extremity/Trunk Assessment              Vision       Perception     Praxis      Cognition Arousal: Alert Behavior During Therapy: WFL for tasks assessed/performed Overall Cognitive Status: Within Functional Limits for tasks assessed  Exercises      Shoulder Instructions       General Comments      Pertinent Vitals/ Pain       Pain Assessment Pain Assessment: Faces Faces Pain Scale: Hurts even more Pain Location: L LE Pain Descriptors / Indicators: Aching, Discomfort, Grimacing Pain Intervention(s): Limited activity within patient's tolerance, Monitored during session, Repositioned  Home Living                                          Prior Functioning/Environment               Frequency  Min 1X/week        Progress Toward Goals  OT Goals(current goals can now be found in the care plan section)  Progress towards OT goals: Progressing toward goals  Acute Rehab OT Goals Patient Stated Goal: get better OT Goal Formulation: With patient Time For Goal Achievement: 11/05/23 Potential to Achieve Goals: Good  Plan      Co-evaluation                 AM-PAC OT "6 Clicks" Daily Activity     Outcome Measure   Help from another person eating meals?: None Help from another person taking care of personal grooming?: A Little Help from another person toileting, which includes using toliet, bedpan, or urinal?: A Little Help from another person bathing (including washing, rinsing, drying)?: A Lot Help from another person to put on and taking off regular upper body clothing?: A Little Help from another person to put on and taking off regular lower body clothing?: A Lot 6 Click Score: 17    End of Session Equipment Utilized During Treatment: Gait belt;Rolling walker (2 wheels)  OT Visit Diagnosis: Other abnormalities of gait and mobility (R26.89);Muscle weakness (generalized) (M62.81);Pain;History of falling (Z91.81) Pain - Right/Left: Left Pain - part of body: Hip   Activity Tolerance Patient tolerated treatment well   Patient Left in chair;with call bell/phone within reach;with chair alarm set   Nurse Communication Mobility status        Time: 1032-1050 OT Time Calculation (min): 18 min  Charges: OT General Charges $OT Visit: 1 Visit OT Treatments $Self Care/Home Management : 8-22 mins  Barry Brunner, OT Acute Rehabilitation Services Office [phone]   Chancy Milroy 10/23/2023, 1:11 PM

## 2023-10-23 NOTE — Progress Notes (Signed)
.  Subjective: 2 Days Post-Op Procedure(s) (LRB): INTRAMEDULLARY (IM) NAIL INTERTROCHANTERIC (Left)  Patient seen and examined bedside. No events overnight   Activity level:  WBAT Diet tolerance:  as tolerated Patient reports pain as moderate.    Objective: Vital signs in last 24 hours: Temp:  [98 F (36.7 C)-98.6 F (37 C)] 98.3 F (36.8 C) (11/14 0427) Pulse Rate:  [79-87] 79 (11/14 0427) Resp:  [16-20] 19 (11/14 0427) BP: (128-160)/(66-79) 160/76 (11/14 0427) SpO2:  [96 %-100 %] 100 % (11/14 0427)  Labs: Recent Labs    10/20/23 1810 10/22/23 0601  HGB 10.4* 10.0*   Recent Labs    10/20/23 1810 10/22/23 0601  WBC 8.0 10.2  RBC 3.60* 3.52*  HCT 32.2* 30.9*  PLT 243 274   Recent Labs    10/20/23 1810 10/21/23 1906  NA 139 134*  K 3.0* 3.2*  CL 103 101  CO2 26 23  BUN 20 13  CREATININE 0.58 0.46  GLUCOSE 98 109*  CALCIUM 8.9 8.7*   No results for input(s): "LABPT", "INR" in the last 72 hours.  Physical Exam:  Neurologically intact Neurovascular intact Sensation intact distally Intact pulses distally Dorsiflexion/Plantar flexion intact Incision: dressing C/D/I    Assessment/Plan:  2 Days Post-Op Procedure(s) (LRB): INTRAMEDULLARY (IM) NAIL INTERTROCHANTERIC (Left)   WBAT Continue PT/OT Patient is homeless and would benefit from [facility] consult Multimodal pain control Aspirin 81mg  BID for 6 weeks for DVT ppx Cleared for discharge from orthopaedic perspective once cleared from PT. Can follow up in the office in 2 weeks for staple removal and XR left hip    Shannon Burgess 10/23/2023, 7:39 AM

## 2023-10-23 NOTE — Progress Notes (Addendum)
PROGRESS NOTE   Shannon Burgess  ZOX:[id number]    DOB: [birth year]    DOA: 10/20/2023  PCP: Pcp, No   I have briefly reviewed patients previous medical records in [facility].  Chief Complaint  Patient presents with   Leg Injury    Brief Hospital Course:  72 y.o. female with medical history significant of HTN, atrial fibrillation not on anticoagulation, CAD s/p stents x2,HLD, MDD, polysubstance abuse, homelessness who presents following mechanical fall at a hotel, was unable to get up for 18 hours, was able to crawl for help.  Had left-sided hip and shoulder pain.  Came to ED.  Pelvic and femur x-ray showed left hip intertrochanteric fracture.  Orthopedic surgery was consulted and patient underwent left hip cephalomedullary nailing on 11/13.   Assessment & Plan:  Principal Problem:   Hip fracture (HCC) Active Problems:   MDD (major depressive disorder)   HTN (hypertension)   History of atrial fibrillation   Hypokalemia   Tobacco use   CAD S/P percutaneous coronary angioplasty    Left hip intertrochanteric fracture -Orthopedics was consulted and she underwent left hip cephalomedullary nailing on 11/13. -Multimodality pain control -PT and OT reassessment today appreciated.  Patient did better today than yesterday.  She ambulated 60 feet with PT.  They recommend home health PT, OT then rolling walker. -TOC input appreciated.  It appears that plans are to return to hotel with son. -As per orthopedics, appears to be on aspirin 81 Mg twice daily for DVT prophylaxis, WBAT and outpatient follow-up in office in 2 weeks for staple removal and x-ray of left hip.   CAD S/P percutaneous coronary angioplasty -s/p stents x 2 at [facility] in 2012 but no records -Last echocardiogram in 04/2022 with EF 60 to 65%, moderate LVH, grade 1 diastolic dysfunction, mild to moderate mitral valve regurgitation, moderate to severe tricuspid valve regurgitation -no anginal symptoms  -Patient was supposed  to be on aspirin 81 Mg daily, Lipitor 80 mg daily but was not taking it. -Currently on aspirin as noted above, once DVT prophylactic doses completed then she can return to aspirin 81 Mg daily.   Hypokalemia -Replaced.  Magnesium 1.8.   History of atrial fibrillation -Patient is not on anticoagulation PTA.  Also not on any rate control medications.   Hypertension -Continue as needed hydralazine  Chronic anemia: Hemoglobin stable in the 10 g range.  Hyponatremia Mild and clinically insignificant.  Body mass index is 15.41 kg/m.   DVT prophylaxis: SCDs Start: 10/20/23 2356     Code Status: Full Code:  Family Communication: Unable to VM for son, mailbox full. Disposition:  Status is: Inpatient Patient did better today with PT and hopefully can do even better tomorrow with possible DC to hotel room with home health services pending TOC assistance.     Consultants:   Orthopedics  Procedures:   As noted above  Antimicrobials:      Subjective:  Denied complaints.  Not much pain reported today.  Objective:   Vitals:   10/23/23 0005 10/23/23 0427 10/23/23 1022 10/23/23 1444  BP: (!) 141/77 (!) 160/76 (!) 154/69 121/72  Pulse: 84 79 79 88  Resp: 20 19 18 18   Temp: 98.6 F (37 C) 98.3 F (36.8 C) 98.2 F (36.8 C) 98.8 F (37.1 C)  TempSrc:   Oral Oral  SpO2: 100% 100% 99% 99%  Weight:      Height:        General exam: Elderly female, moderately built, thinly  nourished sitting up comfortably in chair and did not appear in any distress. Respiratory system: Clear to auscultation.  No increased work of breathing. Cardiovascular system: S1 & S2 heard, RRR. No JVD, murmurs, rubs, gallops or clicks. No pedal edema. Gastrointestinal system: Abdomen is nondistended, soft and nontender. No organomegaly or masses felt. Normal bowel sounds heard. Central nervous system: Alert and oriented. No focal neurological deficits. Extremities: Symmetric 5 x 5 power.  Left hip  surgical site dressing clean and dry. Skin: No rashes, lesions or ulcers Psychiatry: Judgement and insight appear normal. Mood & affect appropriate.     Data Reviewed:   I have personally reviewed following labs and imaging studies   CBC: Recent Labs  Lab 10/20/23 1810 10/22/23 0601 10/23/23 0954  WBC 8.0 10.2 7.9  HGB 10.4* 10.0* 9.8*  HCT 32.2* 30.9* 29.8*  MCV 89.4 87.8 87.4  PLT 243 274 258    Basic Metabolic Panel: Recent Labs  Lab 10/20/23 1810 10/21/23 1906 10/22/23 0610 10/23/23 0954  NA 139 134*  --  130*  K 3.0* 3.2*  --  3.9  CL 103 101  --  98  CO2 26 23  --  24  GLUCOSE 98 109*  --  102*  BUN 20 13  --  13  CREATININE 0.58 0.46  --  0.81  CALCIUM 8.9 8.7*  --  8.7*  MG  --   --  1.8  --     Liver Function Tests: No results for input(s): "AST", "ALT", "ALKPHOS", "BILITOT", "PROT", "ALBUMIN" in the last 168 hours.  CBG: No results for input(s): "GLUCAP" in the last 168 hours.  Microbiology Studies:   Recent Results (from the past 240 hour(s))  Surgical pcr screen     Status: Abnormal   Collection Time: 10/21/23  8:04 AM   Specimen: Nasal Mucosa; Nasal Swab  Result Value Ref Range Status   MRSA, PCR NEGATIVE NEGATIVE Final   Staphylococcus aureus POSITIVE (A) NEGATIVE Final    Comment: (NOTE) The Xpert SA Assay (FDA approved for NASAL specimens in patients 58 years of age and older), is one component of a comprehensive surveillance program. It is not intended to diagnose infection nor to guide or monitor treatment. Performed at [facility], [address] [place], [place] [address]     Radiology Studies:  DG FEMUR MIN 2 VIEWS LEFT  Result Date: 10/21/2023 CLINICAL DATA:  Hip surgery EXAM: LEFT FEMUR 2 VIEWS COMPARISON:  10/20/2023 FINDINGS: Six low resolution intraoperative spot views of the left hip/femur. Total fluoroscopy time was 1 minutes 5 seconds, fluoroscopic dose of 7.61 mGy. The images demonstrate intramedullary rodding  and screw fixation of proximal left femoral fracture. IMPRESSION: Intraoperative fluoroscopic assistance provided during left femoral fracture fixation. Electronically Signed   By: Jasmine Pang M.D.   On: 10/21/2023 21:18   DG C-Arm 1-60 Min-No Report  Result Date: 10/21/2023 Fluoroscopy was utilized by the requesting physician.  No radiographic interpretation.    Scheduled Meds:    aspirin EC  81 mg Oral BID   mupirocin ointment   Nasal BID   senna  1 tablet Oral BID    Continuous Infusions:       LOS: 3 days     Marcellus Scott, MD,  FACP, [facility], [facility], [facility]   [facility] Ad[facility] [place]      To contact the attending provider between 7A-7P or the covering provider during after hours 7P-7A, please log into the  web site [email or web address] and access using universal [place] password for that web site. If you do not have the password, please call the hospital operator.  10/23/2023, 4:36 PM

## 2023-10-24 ENCOUNTER — Other Ambulatory Visit (HOSPITAL_COMMUNITY): Payer: Self-pay

## 2023-10-24 DIAGNOSIS — S72002D Fracture of unspecified part of neck of left femur, subsequent encounter for closed fracture with routine healing: Secondary | ICD-10-CM | POA: Diagnosis not present

## 2023-10-24 LAB — CBC
HCT: 30.4 % — ABNORMAL LOW (ref 36.0–46.0)
Hemoglobin: 9.9 g/dL — ABNORMAL LOW (ref 12.0–15.0)
MCH: 28.4 pg (ref 26.0–34.0)
MCHC: 32.6 g/dL (ref 30.0–36.0)
MCV: 87.1 fL (ref 80.0–100.0)
Platelets: 277 10*3/uL (ref 150–400)
RBC: 3.49 MIL/uL — ABNORMAL LOW (ref 3.87–5.11)
RDW: 12.3 % (ref 11.5–15.5)
WBC: 6.9 10*3/uL (ref 4.0–10.5)
nRBC: 0 % (ref 0.0–0.2)

## 2023-10-24 LAB — BASIC METABOLIC PANEL
Anion gap: 9 (ref 5–15)
BUN: 12 mg/dL (ref 8–23)
CO2: 26 mmol/L (ref 22–32)
Calcium: 9.1 mg/dL (ref 8.9–10.3)
Chloride: 103 mmol/L (ref 98–111)
Creatinine, Ser: 0.44 mg/dL (ref 0.44–1.00)
GFR, Estimated: >60 mL/min (ref >60)
Glucose, Bld: 108 mg/dL — ABNORMAL HIGH (ref 70–99)
Potassium: 4.4 mmol/L (ref 3.5–5.1)
Sodium: 138 mmol/L (ref 135–145)

## 2023-10-24 MED ORDER — ASPIRIN 81 MG PO TBEC
81.0000 mg | DELAYED_RELEASE_TABLET | Freq: Two times a day (BID) | ORAL | 0 refills | Status: DC
  Filled 2023-10-24: qty 45, 28d supply, fill #0

## 2023-10-24 MED ORDER — HYDROCODONE-ACETAMINOPHEN 5-325 MG PO TABS
1.0000 | ORAL_TABLET | Freq: Four times a day (QID) | ORAL | 0 refills | Status: DC | PRN
  Filled 2023-10-24: qty 20, 5d supply, fill #0

## 2023-10-24 NOTE — Discharge Summary (Signed)
Physician Discharge Summary  Shannon Burgess OZH:[id number] DOB: 1951-11-12  PCP: Pcp, No  Admitted from: Home Discharged to: Home  Admit date: 10/20/2023 Discharge date: 10/24/2023  Recommendations for Outpatient Follow-up:    Follow-up Information     Luci Bank, MD. Schedule an appointment as soon as possible for a visit in 2 week(s).   Specialty: Orthopedic Surgery Why: Postop follow-up. Contact information: [address] [place] [address] [phone]         Family Physician. Schedule an appointment as soon as possible for a visit in 1 week(s).   Why: To be seen with repeat labs (CBC & BMP).                 Home Health: Home Health Orders (From admission, onward)     Start     Ordered   10/24/23 1149  Home Health  At discharge       Question Answer Comment  To provide the following care/treatments PT   To provide the following care/treatments OT      10/24/23 1151             Equipment/Devices:     Durable Medical Equipment  (From admission, onward)           Start     Ordered   10/24/23 1150  DME Walker  Once       Comments: 2 wheels as per PT recommendations.  Question Answer Comment  Walker: With 5 Inch Wheels   Patient needs a walker to treat with the following condition Hip fracture (HCC)      10/24/23 1151   10/24/23 1033  For home use only DME Walker rolling  Once       Question Answer Comment  Walker: With 5 Inch Wheels   Patient needs a walker to treat with the following condition Gait instability      10/24/23 1032   10/24/23 1033  For home use only DME Bedside commode  Once       Comments: Confine to one room  Question:  Patient needs a bedside commode to treat with the following condition  Answer:  Gait instability   10/24/23 1032             Discharge Condition: Improved and stable   Code Status: Full Code Diet recommendation:  Discharge Diet Orders (From admission, onward)     Start      Ordered   10/24/23 0000  Diet - low sodium heart healthy        10/24/23 1151             Discharge Diagnoses:  Principal Problem:   Hip fracture (HCC) Active Problems:   MDD (major depressive disorder)   HTN (hypertension)   History of atrial fibrillation   Hypokalemia   Tobacco use   CAD S/P percutaneous coronary angioplasty   Brief Summary: 72 y.o. female with medical history significant of HTN, atrial fibrillation not on anticoagulation, CAD s/p stents x2,HLD, MDD, polysubstance abuse, homelessness who presents following mechanical fall at a hotel, was unable to get up for 18 hours, was able to crawl for help.  Had left-sided hip and shoulder pain.  Came to ED.  Pelvic and femur x-ray showed left hip intertrochanteric fracture.  Orthopedic surgery was consulted and patient underwent left hip cephalomedullary nailing on 11/13.     Assessment & Plan:   Left hip intertrochanteric fracture -Orthopedics was consulted and she underwent left hip cephalomedullary nailing  on 11/13. -Multimodality pain control -PT and OT reassessment 11/14 appreciated.  Patient did better today than day prior.  She ambulated 60 feet with PT.  They recommend home health PT, OT and rolling walker. -As per orthopedics, appears to be on aspirin 81 Mg as below for DVT prophylaxis, WBAT and outpatient follow-up in office in 2 weeks for staple removal and x-ray of left hip. -As per [facility] coordination, patient will be returning to the hotel with her son.   CAD S/P percutaneous coronary angioplasty -s/p stents x 2 at [facility] in 2012 but no records -Last echocardiogram in 04/2022 with EF 60 to 65%, moderate LVH, grade 1 diastolic dysfunction, mild to moderate mitral valve regurgitation, moderate to severe tricuspid valve regurgitation -no anginal symptoms  -Patient was supposed to be on aspirin 81 Mg daily, Lipitor 80 mg daily but was not taking it. -Currently on aspirin as noted above, once DVT prophylactic  doses completed then she can return to aspirin 81 Mg daily. -Defer further management to outpatient follow-up.  Patient was not taking any of the medications that are on the discontinued list below.   Hypokalemia -Replaced.  Magnesium 1.8.   History of atrial fibrillation -Patient is not on anticoagulation PTA.  Also not on any rate control medications.   Hypertension -Controlled off of meds   Chronic anemia: Hemoglobin stable in the high 9 g range   Hyponatremia Mild and clinically insignificant.  Resolved on day of discharge   Body mass index is 15.41 kg/m.      Consultants:   Orthopedics   Procedures:   As noted above   Discharge Instructions  Discharge Instructions     Call MD for:  difficulty breathing, headache or visual disturbances   Complete by: As directed    Call MD for:  extreme fatigue   Complete by: As directed    Call MD for:  persistant dizziness or light-headedness   Complete by: As directed    Call MD for:  persistant nausea and vomiting   Complete by: As directed    Call MD for:  redness, tenderness, or signs of infection (pain, swelling, redness, odor or green/yellow discharge around incision site)   Complete by: As directed    Call MD for:  severe uncontrolled pain   Complete by: As directed    Call MD for:  temperature >100.4   Complete by: As directed    Diet - low sodium heart healthy   Complete by: As directed    Discharge instructions   Complete by: As directed    After completion of current prescribed dose of aspirin, continue prior home dose of aspirin 81 Mg daily as per your prior home regimen.   Increase activity slowly   Complete by: As directed         Medication List     STOP taking these medications    atorvastatin 80 MG tablet Commonly known as: LIPITOR   DULoxetine 60 MG capsule Commonly known as: CYMBALTA   hydrOXYzine 25 MG tablet Commonly known as: ATARAX   Iron (Ferrous Sulfate) 325 (65 Fe) MG Tabs    multivitamin with minerals Tabs tablet   nicotine 21 mg/24hr patch Commonly known as: NICODERM CQ - dosed in mg/24 hours   risperiDONE 2 MG tablet Commonly known as: RISPERDAL   traZODone 100 MG tablet Commonly known as: DESYREL       TAKE these medications    aspirin EC 81 MG tablet Take 1 tablet (81  mg total) by mouth 2 (two) times daily after a meal for 2 weeks then once a day for 2 weeks for DVT prevention. What changed:  when to take this additional instructions   HYDROcodone-acetaminophen 5-325 MG tablet Commonly known as: NORCO/VICODIN Take 1 tablet by mouth every 6 (six) hours as needed for moderate pain (pain score 4-6) or severe pain (pain score 7-10) (post op pain).   polyethylene glycol 17 g packet Commonly known as: MIRALAX / GLYCOLAX Take 17 g by mouth daily as needed for mild constipation.       No Known Allergies    Procedures/Studies: DG FEMUR MIN 2 VIEWS LEFT  Result Date: 10/21/2023 CLINICAL DATA:  Hip surgery EXAM: LEFT FEMUR 2 VIEWS COMPARISON:  10/20/2023 FINDINGS: Six low resolution intraoperative spot views of the left hip/femur. Total fluoroscopy time was 1 minutes 5 seconds, fluoroscopic dose of 7.61 mGy. The images demonstrate intramedullary rodding and screw fixation of proximal left femoral fracture. IMPRESSION: Intraoperative fluoroscopic assistance provided during left femoral fracture fixation. Electronically Signed   By: Jasmine Pang M.D.   On: 10/21/2023 21:18   DG C-Arm 1-60 Min-No Report  Result Date: 10/21/2023 Fluoroscopy was utilized by the requesting physician.  No radiographic interpretation.   DG Chest Port 1 View  Result Date: 10/20/2023 CLINICAL DATA:  Preop EXAM: PORTABLE CHEST 1 VIEW COMPARISON:  Chest x-ray 05/03/2022 FINDINGS: The heart size and mediastinal contours are within normal limits. Both lungs are clear. The visualized skeletal structures are unremarkable. IMPRESSION: No active disease. Electronically  Signed   By: Darliss Cheney M.D.   On: 10/20/2023 22:45   DG Pelvis 1-2 Views  Result Date: 10/20/2023 CLINICAL DATA:  Pain after fall. Proximal femoral pain. Mechanical fall on Friday. Unable to bear weight. EXAM: PELVIS - 1-2 VIEW; LEFT FEMUR 2 VIEWS COMPARISON:  None Available. FINDINGS: Fracture of the proximal femur is best appreciated on the lateral femoral view, appears to be intertrochanteric. This cannot be further characterized on the frontal views. No hip dislocation. 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 No pubic symphyseal or sacroiliac diastasis. The distal femur is intact. Normal knee alignment. IMPRESSION: Acute fracture of the proximal femur, only appreciated on the cross-table lateral view. This is likely intertrochanteric, however recommend characterization with CT to further delineate the fracture. Electronically Signed   By: Narda Rutherford M.D.   On: 10/20/2023 17:53   DG FEMUR MIN 2 VIEWS LEFT  Result Date: 10/20/2023 CLINICAL DATA:  Pain after fall. Proximal femoral pain. Mechanical fall on Friday. Unable to bear weight. EXAM: PELVIS - 1-2 VIEW; LEFT FEMUR 2 VIEWS COMPARISON:  None Available. FINDINGS: Fracture of the proximal femur is best appreciated on the lateral femoral view, appears to be intertrochanteric. This cannot be further characterized on the frontal views. No hip dislocation. 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 No pubic symphyseal or sacroiliac diastasis. The distal femur is intact. Normal knee alignment. IMPRESSION: Acute fracture of the proximal femur, only appreciated on the cross-table lateral view. This is likely intertrochanteric, however recommend characterization with CT to further delineate the fracture. Electronically Signed   By: Narda Rutherford M.D.   On: 10/20/2023 17:53      Subjective: Denies complaints.  Pain controlled.  States that she did much better with PT yesterday and was ambulating in the room up to the bathroom and even to the halls.   No chest pain, dyspnea, dizziness or any other complaints.  Discharge Exam:  Vitals:   10/23/23 1444 10/23/23 1959 10/24/23  0500 10/24/23 0806  BP: 121/72 (!) 140/72 118/78 136/86  Pulse: 88 85 83 83  Resp: 18 18 18 18   Temp: 98.8 F (37.1 C) 98.7 F (37.1 C) 98.6 F (37 C) 98.8 F (37.1 C)  TempSrc: Oral Oral Oral Oral  SpO2: 99% 100% 100% 98%  Weight:      Height:       Stable and no new exam findings compared to yesterday. General exam: Elderly female, moderately built, thinly nourished sitting up comfortably in chair and did not appear in any distress. Respiratory system: Clear to auscultation.  No increased work of breathing. Cardiovascular system: S1 & S2 heard, RRR. No JVD, murmurs, rubs, gallops or clicks. No pedal edema. Gastrointestinal system: Abdomen is nondistended, soft and nontender. No organomegaly or masses felt. Normal bowel sounds heard. Central nervous system: Alert and oriented. No focal neurological deficits. Extremities: Symmetric 5 x 5 power.  Left hip surgical site dressing clean and dry. Skin: No rashes, lesions or ulcers Psychiatry: Judgement and insight appear normal. Mood & affect appropriate.     The results of significant diagnostics from this hospitalization (including imaging, microbiology, ancillary and laboratory) are listed below for reference.     Microbiology: Recent Results (from the past 240 hour(s))  Surgical pcr screen     Status: Abnormal   Collection Time: 10/21/23  8:04 AM   Specimen: Nasal Mucosa; Nasal Swab  Result Value Ref Range Status   MRSA, PCR NEGATIVE NEGATIVE Final   Staphylococcus aureus POSITIVE (A) NEGATIVE Final    Comment: (NOTE) The Xpert SA Assay (FDA approved for NASAL specimens in patients 5 years of age and older), is one component of a comprehensive surveillance program. It is not intended to diagnose infection nor to guide or monitor treatment. Performed at [facility], [address],  [place], [place] [address]      Labs: CBC: Recent Labs  Lab 10/20/23 1810 10/22/23 0601 10/23/23 0954 10/24/23 0645  WBC 8.0 10.2 7.9 6.9  HGB 10.4* 10.0* 9.8* 9.9*  HCT 32.2* 30.9* 29.8* 30.4*  MCV 89.4 87.8 87.4 87.1  PLT 243 274 258 277    Basic Metabolic Panel: Recent Labs  Lab 10/20/23 1810 10/21/23 1906 10/22/23 0610 10/23/23 0954 10/24/23 0645  NA 139 134*  --  130* 138  K 3.0* 3.2*  --  3.9 4.4  CL 103 101  --  98 103  CO2 26 23  --  24 26  GLUCOSE 98 109*  --  102* 108*  BUN 20 13  --  13 12  CREATININE 0.58 0.46  --  0.81 0.44  CALCIUM 8.9 8.7*  --  8.7* 9.1  MG  --   --  1.8  --   --      Time coordinating discharge: 40 minutes  SIGNED:  Marcellus Scott, MD,  FACP, [facility], [facility], [facility]   [facility] Phys[facility] Ad[facility] [place]     To contact the attending provider between 7A-7P or the covering provider during after hours 7P-7A, please log into the web site [email or web address] and access using universal [place] password for that web site. If you do not have the password, please call the hospital operator.

## 2023-10-24 NOTE — Progress Notes (Signed)
    Durable Medical Equipment  (From admission, onward)           Start     Ordered   10/24/23 1033  For home use only DME Walker rolling  Once       Question Answer Comment  Walker: With 5 Inch Wheels   Patient needs a walker to treat with the following condition Gait instability      10/24/23 1032   10/24/23 1033  For home use only DME Bedside commode  Once       Comments: Confine to one room  Question:  Patient needs a bedside commode to treat with the following condition  Answer:  Gait instability   10/24/23 1032

## 2023-10-24 NOTE — Discharge Instructions (Signed)

## 2023-10-24 NOTE — TOC Progression Note (Addendum)
Transition of Care [facility]) - Progression Note    Patient Details  Name: Shannon Burgess MRN: [id number] Date of Birth: [birth year]  [facility] CM/SW Contact  Lorri Frederick, LCSW Phone Number: 10/24/2023, 9:50 AM  Clinical Narrative:   CSW and pt called pt son [name], who confirmed that he does have a hotel room at the [facility] [address], [address].  [name] reports there is 1/2 flight of stairs to get to the room, he will talk to manager and see if ground level room is available.     1315: CSW spoke with [name] who now reports that in the process of trying to get a ground floor room, he has lost his 2nd floor room at the [facility] and they do not have anything else available.  He is trying to find another option and will stay in touch.     Expected Discharge Plan: Home w Home Health Services Barriers to Discharge: Continued Medical Work up  Expected Discharge Plan and Services In-house Referral: Clinical Social Work     Living arrangements for the past 2 months: Hotel/Motel                                       Social Determinants of Health (SDOH) Interventions SDOH Screenings   Food Insecurity: Food Insecurity Present (10/21/2023)  Housing: High Risk (10/21/2023)  Transportation Needs: Unmet Transportation Needs (10/21/2023)  Utilities: At Risk (10/21/2023)  Depression (PHQ2-9): High Risk (04/28/2022)  Tobacco Use: High Risk (10/21/2023)    Readmission Risk Interventions     No data to display

## 2023-10-24 NOTE — TOC Transition Note (Incomplete)
Transition of Care [facility]) - CM/SW Discharge Note   Patient Details  Name: Shannon Burgess MRN: [id number] Date of Birth: 1951-04-03  Transition of Care [facility]) CM/SW Contact:  Epifanio Lesches, RN Phone Number: 10/24/2023, 10:35 AM   Clinical Narrative:    Patient will DC to: [facility] Anticipated DC date: 10/24/2023 Family notified:yes Transport by: car  Per MD patient ready for DC today. RN, patient, and  patient's son notified of DC. Orders for home health services and DME noted. Pt agreeable to services. Pt will transition to a hotel, [facility] [address]. Home health referral made with [facility] and accepted. DMEreferral   DC packet on chart. Ambulance transport requested for patient.   RNCM will sign off for now as intervention is no longer needed. Please consult [place] again if new needs arise.    Final next level of care: Home w Home Health Services Barriers to Discharge: No Barriers Identified   Patient Goals and CMS Choice   Choice offered to / list presented to : Patient  Discharge Placement                         Discharge Plan and Services Additional resources added to the After Visit Summary for   In-house Referral: Clinical Social Work              DME Arranged: Bedside commode, Walker rolling DME Agency: [employer] Date DME Agency Contacted: 10/24/23 Time DME Agency Contacted: 1030 Representative spoke with at DME Agency: Ian Malkin HH Arranged: PT, OT HH Agency: [facility] Date [facility] Agency Contacted: 10/24/23 Time HH Agency Contacted: 1029 Representative spoke with at [facility] Agency: Tresa Endo  Social Determinants of Health (SDOH) Interventions SDOH Screenings   Food Insecurity: Food Insecurity Present (10/21/2023)  Housing: High Risk (10/21/2023)  Transportation Needs: Unmet Transportation Needs (10/21/2023)  Utilities: At Risk (10/21/2023)  Depression (PHQ2-9): High Risk (04/28/2022)  Tobacco Use: High Risk (10/21/2023)      Readmission Risk Interventions     No data to display

## 2023-10-24 NOTE — Progress Notes (Signed)
Please see previous note for SDOH details.  Resource list provided to pt for [employer], [employer], [employer], [employer], food pantry options, and other financial assistance programs in [place].  Daleen Squibb, MSW, LCSW 11/15/202410:35 AM

## 2023-10-24 NOTE — TOC Progression Note (Signed)
Transition of Care [facility]) - Progression Note    Patient Details  Name: Shannon Burgess MRN: [id number] Date of Birth: [birth year]  Transition of Care [facility]) CM/SW Contact  Epifanio Lesches, RN Phone Number: 10/24/2023, 1:59 PM  Clinical Narrative:    Son informed TOC team, lost hotel room @ [employer] [address] [place]. Currently pt without housing. Son to f/u with [facility] team once he secures a place.  Orders for home health and DME needs noted. Pt agreeable to services. Pt without provider preference. Referral made with [facility] and accepted for home health services.  Referral made with [employer] for DME needs. Equipment will be delivered to bedside prior to d/c.  [facility] The team following and will continue assisting with needs...  Expected Discharge Plan: Home w Home Health Services Barriers to Discharge: No Barriers Identified  Expected Discharge Plan and Services In-house Referral: Clinical Social Work     Living arrangements for the past 2 months: Hotel/Motel Expected Discharge Date: 10/24/23               DME Arranged: Bedside commode, Walker rolling DME Agency: [employer] Date DME Agency Contacted: 10/24/23 Time DME Agency Contacted: 1030 Representative spoke with at DME Agency: Ian Malkin HH Arranged: PT, OT HH Agency: [facility] Date [facility] Agency Contacted: 10/24/23 Time HH Agency Contacted: 1029 Representative spoke with at [facility] Agency: Tresa Endo   Social Determinants of Health (SDOH) Interventions SDOH Screenings   Food Insecurity: Food Insecurity Present (10/21/2023)  Housing: High Risk (10/21/2023)  Transportation Needs: Unmet Transportation Needs (10/21/2023)  Utilities: At Risk (10/21/2023)  Depression (PHQ2-9): High Risk (04/28/2022)  Tobacco Use: High Risk (10/21/2023)    Readmission Risk Interventions     No data to display

## 2023-10-24 NOTE — Progress Notes (Signed)
Subjective: 3 Days Post-Op Procedure(s) (LRB): INTRAMEDULLARY (IM) NAIL INTERTROCHANTERIC (Left)  Patient doing better today. She walked better with PT yesterday. SHe is hoping to leave today if deemed safe.   Activity level:  wbat Diet tolerance:  ok Voiding:  ok Patient reports pain as mild and moderate.    Objective: Vital signs in last 24 hours: Temp:  [98.2 F (36.8 C)-98.8 F (37.1 C)] 98.6 F (37 C) (11/15 0500) Pulse Rate:  [79-88] 83 (11/15 0500) Resp:  [18] 18 (11/15 0500) BP: (118-154)/(69-78) 118/78 (11/15 0500) SpO2:  [99 %-100 %] 100 % (11/15 0500)  Labs: Recent Labs    10/22/23 0601 10/23/23 0954 10/24/23 0645  HGB 10.0* 9.8* 9.9*   Recent Labs    10/23/23 0954 10/24/23 0645  WBC 7.9 6.9  RBC 3.41* 3.49*  HCT 29.8* 30.4*  PLT 258 277   Recent Labs    10/23/23 0954 10/24/23 0645  NA 130* 138  K 3.9 4.4  CL 98 103  CO2 24 26  BUN 13 12  CREATININE 0.81 0.44  GLUCOSE 102* 108*  CALCIUM 8.7* 9.1   No results for input(s): "LABPT", "INR" in the last 72 hours.  Physical Exam:  Neurologically intact ABD soft Neurovascular intact Sensation intact distally Intact pulses distally Dorsiflexion/Plantar flexion intact Incision: dressing C/D/I and no drainage No cellulitis present Compartment soft  Assessment/Plan:  3 Days Post-Op Procedure(s) (LRB): INTRAMEDULLARY (IM) NAIL INTERTROCHANTERIC (Left) Advance diet Up with therapy Discharge home with home health once cleared by PT and medicine team. From orthopaedic standpoint she is good to go. I sent in Norco for pain control and 81mg  ASA for DVT prevention.  Her plan is to go home with son to a hotel. Follow up in office 2 weeks for staple removal and x rays. We greatly appreciate medical management.   Ginger Organ Tikesha Mort 10/24/2023, 7:46 AM

## 2023-10-24 NOTE — Progress Notes (Signed)
Mobility Specialist: Progress Note   10/24/23 1327  Mobility  Activity Transferred to/from [facility];Ambulated with assistance in room  Level of Assistance Contact guard assist, steadying assist  Assistive Device Front wheel walker  Distance Ambulated (ft) 40 ft  LLE Weight Bearing WBAT  Activity Response Tolerated well  Mobility Referral Yes  $Mobility charge 1 Mobility  Mobility Specialist Start Time (ACUTE ONLY) 1136  Mobility Specialist Stop Time (ACUTE ONLY) 1150  Mobility Specialist Time Calculation (min) (ACUTE ONLY) 14 min    Pt requested to use BR - received in bed. Opted for [facility] as the quicker option. Completed void and peri care independently. Pt was agreeable to further ambulation in room. CG for bed mobility to assist LE on/off bed, CG for STS and ambulation as well. Had c/o intense L hip pain. Left in bed with all needs met, call bell in reach.   Maurene Capes Mobility Specialist Please contact via SecureChat or Rehab office at [phone]

## 2023-10-24 NOTE — Progress Notes (Signed)
Physical Therapy Treatment Patient Details Name: Shannon Burgess: [id number] DOB: November 15, 1951 Today's Date: 10/24/2023   History of Present Illness 72 y/o female [facility] after tripping and falling on L side. Pt w/ L intertrochanteric hip fx, s/p L IM nail 11/12. PMHx: polysubstance abuse (tobacco, cocaine, methamphetamine, heroin), CAD s/p percutaneous coronary angioplasty, HTN, HLD, depression, and anxiety with panic attacks.    PT Comments  Pt in bed upon arrival and agreeable to PT session. Pt was able to improve with needing less assistance for bed mobility today. Worked on gait training in today's session. Pt continue to have increased pain while bearing weight on L LE during gait. Pt is able to take slow and steady steps with supervision for safety. Pt was in increased pain after ambulating ~40 ft and requested to lay down. Pt is progressing towards goals. Acute PT to follow.      If plan is discharge home, recommend the following: A little help with walking and/or transfers;A little help with bathing/dressing/bathroom   Can travel by private vehicle      Yes  Equipment Recommendations  Rolling walker (2 wheels)       Precautions / Restrictions Precautions Precautions: Fall Restrictions Weight Bearing Restrictions: Yes LLE Weight Bearing: Weight bearing as tolerated     Mobility  Bed Mobility Overal bed mobility: Needs Assistance Bed Mobility: Supine to Sit, Sit to Supine     Supine to sit: Supervision Sit to supine: Supervision   General bed mobility comments: supervision for safety    Transfers Overall transfer level: Needs assistance Equipment used: Rolling walker (2 wheels) Transfers: Sit to/from Stand Sit to Stand: Supervision      General transfer comment: supervision for safety, cues for hand placement w/ RW    Ambulation/Gait Ambulation/Gait assistance: Supervision Gait Distance (Feet): 40 Feet Assistive device: Rolling walker (2  wheels) Gait Pattern/deviations: Step-to pattern, Shuffle, Decreased step length - right, Decreased stance time - right Gait velocity: dec     General Gait Details: slow and steady gait w/ decreased WB on L LE       Balance Overall balance assessment: Needs assistance Sitting-balance support: No upper extremity supported, Feet supported Sitting balance-Leahy Scale: Good     Standing balance support: Bilateral upper extremity supported, During functional activity Standing balance-Leahy Scale: Poor Standing balance comment: reliant on UE support     Cognition Arousal: Alert Behavior During Therapy: WFL for tasks assessed/performed Overall Cognitive Status: Within Functional Limits for tasks assessed         General Comments General comments (skin integrity, edema, etc.): VSS on RA      Pertinent Vitals/Pain Pain Assessment Pain Assessment: Faces Faces Pain Scale: Hurts even more Pain Location: L LE Pain Descriptors / Indicators: Aching, Discomfort, Grimacing Pain Intervention(s): Limited activity within patient's tolerance, Monitored during session, Repositioned, Patient requesting pain meds-RN notified     PT Goals (current goals can now be found in the care plan section) Acute Rehab PT Goals PT Goal Formulation: With patient Time For Goal Achievement: 11/05/23 Potential to Achieve Goals: Good Progress towards PT goals: Progressing toward goals    Frequency    Min 1X/week       AM-PAC PT "6 Clicks" Mobility   Outcome Measure  Help needed turning from your back to your side while in a flat bed without using bedrails?: None Help needed moving from lying on your back to sitting on the side of a flat bed without using bedrails?: A  Little Help needed moving to and from a bed to a chair (including a wheelchair)?: A Little Help needed standing up from a chair using your arms (e.g., wheelchair or bedside chair)?: A Little Help needed to walk in hospital room?: A  Little Help needed climbing 3-5 steps with a railing? : A Lot 6 Click Score: 18    End of Session Equipment Utilized During Treatment: Gait belt Activity Tolerance: Patient limited by pain Patient left: in bed;with call bell/phone within reach;with bed alarm set Nurse Communication: Mobility status;Patient requests pain meds PT Visit Diagnosis: Unsteadiness on feet (R26.81);Repeated falls (R29.6);Muscle weakness (generalized) (M62.81)     Time: 1610-9604 PT Time Calculation (min) (ACUTE ONLY): 15 min  Charges:    $Gait Training: 8-22 mins PT General Charges $$ ACUTE PT VISIT: 1 Visit                     Hilton Cork, PT, DPT Secure Chat Preferred  Rehab Office [phone]   Arturo Morton Brion Aliment 10/24/2023, 3:34 PM

## 2023-10-25 ENCOUNTER — Other Ambulatory Visit (HOSPITAL_COMMUNITY): Payer: Self-pay

## 2023-10-25 NOTE — TOC CAGE-AID Note (Signed)
Transition of Care [facility]) - CAGE-AID Screening   Patient Details  Name: Algie Ngai MRN: [id number] Date of Birth: 1951/04/30  Transition of Care [facility]) CM/SW Contact:    Katha Hamming, RN Phone Number: 10/25/2023, 8:00 PM   Clinical Narrative:  Denies drug/alcohol use currently. Hx polysubstance abuse. No resources indicated.  CAGE-AID Screening:    Have You Ever Felt You Ought to Cut Down on Your Drinking or Drug Use?: No Have People Annoyed You By Critizing Your Drinking Or Drug Use?: No Have You Felt Bad Or Guilty About Your Drinking Or Drug Use?: No Have You Ever Had a Drink or Used Drugs First Thing In The Morning to Steady Your Nerves or to Get Rid of a Hangover?: No CAGE-AID Score: 0  Substance Abuse Education Offered: No

## 2023-10-25 NOTE — Progress Notes (Signed)
1729: Patient son communicated to patient that son was unable to secure accomodation (hotel room) for a safe discharge. Provider notified. Patient's taxi voucher placed in back of patient's chart. TOC medications located in main pharmacy per [facility] pharmacy technician.

## 2023-10-25 NOTE — Progress Notes (Signed)
Subjective: 4 Days Post-Op Procedure(s) (LRB): INTRAMEDULLARY (IM) NAIL INTERTROCHANTERIC (Left)  Patient resting comfortably in bed. Her plan to go home with son did not work out.   Activity level:  wbat Diet tolerance:  ok Voiding:  ok Patient reports pain as mild.    Objective: Vital signs in last 24 hours: Temp:  [98.2 F (36.8 C)-99.1 F (37.3 C)] 98.7 F (37.1 C) (11/16 0741) Pulse Rate:  [80-99] 88 (11/16 0741) Resp:  [17-18] 18 (11/16 0741) BP: (122-154)/(75-82) 145/79 (11/16 0741) SpO2:  [98 %-100 %] 100 % (11/16 0741)  Labs: Recent Labs    10/23/23 0954 10/24/23 0645  HGB 9.8* 9.9*   Recent Labs    10/23/23 0954 10/24/23 0645  WBC 7.9 6.9  RBC 3.41* 3.49*  HCT 29.8* 30.4*  PLT 258 277   Recent Labs    10/23/23 0954 10/24/23 0645  NA 130* 138  K 3.9 4.4  CL 98 103  CO2 24 26  BUN 13 12  CREATININE 0.81 0.44  GLUCOSE 102* 108*  CALCIUM 8.7* 9.1   No results for input(s): "LABPT", "INR" in the last 72 hours.  Physical Exam:  Neurologically intact ABD soft Neurovascular intact Sensation intact distally Intact pulses distally Dorsiflexion/Plantar flexion intact Incision: dressing C/D/I and no drainage No cellulitis present Compartment soft  Assessment/Plan:  4 Days Post-Op Procedure(s) (LRB): INTRAMEDULLARY (IM) NAIL INTERTROCHANTERIC (Left) Advance diet Up with therapy Discharge once cleared by PT and medicine team. From orthopaedic standpoint she is good to go. Continue on Norco for pain control and 81mg  ASA for DVT prevention.  Her current discharge location is still up in the air but is being managed by patient and primary care team. Follow up in office 2 weeks for staple removal and x rays. We greatly appreciate medical management.   Ginger Organ Girard Koontz 10/25/2023, 8:47 AM

## 2023-10-25 NOTE — TOC Progression Note (Addendum)
Transition of Care [facility]) - Progression Note    Patient Details  Name: Shannon Burgess MRN: [id number] Date of Birth: 06-23-1951  Transition of Care [facility]) CM/SW Contact  Ronny Bacon, RN Phone Number: 10/25/2023, 9:38 AM  Clinical Narrative:   Phone call attempt made to reach son to discuss housing situation for patient. Phone number on file message received that phone has calling restrictions. Spoke with patient and she reports she is trying to get a hold of her son to figure out what the status is on the housing situation.  1403: Secure chat received from floor nurse, patient able to talk with son who confirms he has housing arranged for patient that will be ready this afternoon.  1522: Secure message from floor nurse that patient will need cab voucher for transportation assistance at discharge. Spoke with patient by phone to get address of where she will discharge to. Per patient son has not made housing arrangements yet and will not make arrangements until he gets off from work around 1645. Cab vucher left with unit secretary to be completed once patient discharge destination is known.   Expected Discharge Plan: Home w Home Health Services Barriers to Discharge: No Barriers Identified  Expected Discharge Plan and Services In-house Referral: Clinical Social Work     Living arrangements for the past 2 months: Hotel/Motel Expected Discharge Date: 10/24/23               DME Arranged: Bedside commode, Walker rolling DME Agency: [employer] Date DME Agency Contacted: 10/24/23 Time DME Agency Contacted: 1030 Representative spoke with at DME Agency: Ian Malkin HH Arranged: PT, OT HH Agency: [facility] Date [facility] Agency Contacted: 10/24/23 Time HH Agency Contacted: 1029 Representative spoke with at [facility] Agency: Tresa Endo   Social Determinants of Health (SDOH) Interventions SDOH Screenings   Food Insecurity: Food Insecurity Present (10/21/2023)  Housing: High Risk (10/21/2023)   Transportation Needs: Unmet Transportation Needs (10/21/2023)  Utilities: At Risk (10/21/2023)  Depression (PHQ2-9): High Risk (04/28/2022)  Tobacco Use: High Risk (10/21/2023)    Readmission Risk Interventions     No data to display

## 2023-10-25 NOTE — Progress Notes (Signed)
No charge progress note  11/15, as per report, son had hotel room for patient's safe discharge.  Postdischarge however, they lost the room.  Patient remains medically stable for DC without new complaints and stable exam.  As per patient's RN communication, it appears that son was able to arrange accommodation for safe discharge.  TOC coordinating transport etc.  Marcellus Scott, MD,  FACP, [facility], [facility], [facility]   [facility] [place]     To contact the attending provider between 7A-7P or the covering provider during after hours 7P-7A, please log into the web site [email or web address] and access using universal [place] password for that web site. If you do not have the password, please call the hospital operator.

## 2023-10-25 NOTE — Plan of Care (Signed)
  Problem: Clinical Measurements: Goal: Respiratory complications will improve Outcome: Progressing   Problem: Activity: Goal: Risk for activity intolerance will decrease Outcome: Progressing   Problem: Nutrition: Goal: Adequate nutrition will be maintained Outcome: Not Progressing   Problem: Coping: Goal: Level of anxiety will decrease Outcome: Not Progressing   Problem: Pain Management: Goal: General experience of comfort will improve Outcome: Not Progressing

## 2023-10-26 NOTE — Progress Notes (Signed)
No charge progress note  Patient remains without any new complaints.  Stable vital signs and physical exam.  Ongoing difficulties for patient/son to find housing.  Met and discussed with patient's son at bedside and he indicated that he continues to make phone calls and is trying to find a place to live.  Patient remains stable for DC.  Discussed with patient's RN.  When housing is arranged, TOC has already arranged for a cab voucher.  Marcellus Scott, MD,  FACP, [facility], [facility], [facility]   [facility] [place]     To contact the attending provider between 7A-7P or the covering provider during after hours 7P-7A, please log into the web site [email or web address] and access using universal [place] password for that web site. If you do not have the password, please call the hospital operator.

## 2023-10-26 NOTE — Plan of Care (Signed)
  Problem: Pain Management: Goal: General experience of comfort will improve Outcome: Progressing   Problem: Safety: Goal: Ability to remain free from injury will improve Outcome: Progressing   Problem: Skin Integrity: Goal: Risk for impaired skin integrity will decrease Outcome: Progressing

## 2023-10-26 NOTE — Progress Notes (Signed)
Subjective: 5 Days Post-Op Procedure(s) (LRB): INTRAMEDULLARY (IM) NAIL INTERTROCHANTERIC (Left)  Patient resting comfortably in bed this morning. Her son is with her today. They are still trying to secure housing at this time for discharge.  Activity level:  wbat Diet tolerance:  ok Voiding:  ok Patient reports pain as mild.    Objective: Vital signs in last 24 hours: Temp:  [97.8 F (36.6 C)-98.8 F (37.1 C)] 98.5 F (36.9 C) (11/17 0744) Pulse Rate:  [78-93] 78 (11/17 0744) Resp:  [18] 18 (11/17 0744) BP: (124-153)/(71-94) 143/94 (11/17 0744) SpO2:  [98 %-100 %] 99 % (11/17 0744)  Labs: Recent Labs    10/23/23 0954 10/24/23 0645  HGB 9.8* 9.9*   Recent Labs    10/23/23 0954 10/24/23 0645  WBC 7.9 6.9  RBC 3.41* 3.49*  HCT 29.8* 30.4*  PLT 258 277   Recent Labs    10/23/23 0954 10/24/23 0645  NA 130* 138  K 3.9 4.4  CL 98 103  CO2 24 26  BUN 13 12  CREATININE 0.81 0.44  GLUCOSE 102* 108*  CALCIUM 8.7* 9.1   No results for input(s): "LABPT", "INR" in the last 72 hours.  Physical Exam:  Neurologically intact ABD soft Neurovascular intact Sensation intact distally Intact pulses distally Dorsiflexion/Plantar flexion intact Incision: dressing C/D/I and no drainage No cellulitis present Compartment soft  Assessment/Plan:  5 Days Post-Op Procedure(s) (LRB): INTRAMEDULLARY (IM) NAIL INTERTROCHANTERIC (Left) Advance diet Up with therapy Continue on Norco for pain control and 81mg  ASA for DVT prevention.  Her current discharge location is still up in the air but is being managed by patient and primary care team. Follow up in office 2 weeks for staple removal and x rays. We greatly appreciate medical management.   Ginger Organ Ourania Hamler 10/26/2023, 9:01 AM

## 2023-10-26 NOTE — Plan of Care (Signed)

## 2023-10-27 ENCOUNTER — Other Ambulatory Visit (HOSPITAL_COMMUNITY): Payer: Self-pay

## 2023-10-27 DIAGNOSIS — S72002D Fracture of unspecified part of neck of left femur, subsequent encounter for closed fracture with routine healing: Secondary | ICD-10-CM | POA: Diagnosis not present

## 2023-10-27 NOTE — Progress Notes (Signed)
PROGRESS NOTE   Shannon Burgess  WUJ:[id number]    DOB: 04/17/1951    DOA: 10/20/2023  PCP: Pcp, No   I have briefly reviewed patients previous medical records in [facility].  Chief Complaint  Patient presents with   Leg Injury    Brief Hospital Course:  72 y.o. female with medical history significant of HTN, atrial fibrillation not on anticoagulation, CAD s/p stents x2,HLD, MDD, polysubstance abuse, homelessness who presents following mechanical fall at a hotel, was unable to get up for 18 hours, was able to crawl for help.  Had left-sided hip and shoulder pain.  Came to ED.  Pelvic and femur x-ray showed left hip intertrochanteric fracture.  Orthopedic surgery was consulted and patient underwent left hip cephalomedullary nailing on 11/13.  Patient was discharged on 11/15 at which time patient/family had a hotel room to return to.  However since then the last the hotel room, currently without safe disposition and hence remains in the hospital.  Patient remained stable for discharge.   Assessment & Plan:  Principal Problem:   Hip fracture (HCC) Active Problems:   MDD (major depressive disorder)   HTN (hypertension)   History of atrial fibrillation   Hypokalemia   Tobacco use   CAD S/P percutaneous coronary angioplasty    Left hip intertrochanteric fracture -Orthopedics was consulted and she underwent left hip cephalomedullary nailing on 11/13. -Multimodality pain control -PT and OT reassessment 11/14 appreciated.  Patient did better today than day prior.  She ambulated 60 feet with PT.  They recommend home health PT, OT and rolling walker. -As per orthopedics, appears to be on aspirin 81 Mg as below for DVT prophylaxis, WBAT and outpatient follow-up in office in 2 weeks for staple removal and x-ray of left hip. -Awaiting safe disposition.  Patient/son calling around and working on it.  TOC aware.   CAD S/P percutaneous coronary angioplasty -s/p stents x 2 at [facility] in 2012  but no records -Last echocardiogram in 04/2022 with EF 60 to 65%, moderate LVH, grade 1 diastolic dysfunction, mild to moderate mitral valve regurgitation, moderate to severe tricuspid valve regurgitation -no anginal symptoms  -Patient was supposed to be on aspirin 81 Mg daily, Lipitor 80 mg daily but was not taking it. -Currently on aspirin as noted above, once DVT prophylactic doses completed then she can return to aspirin 81 Mg daily. -Defer further management to outpatient follow-up.  Patient was not taking any of the medications that are on the discontinued list below.   Hypokalemia -Replaced.  Magnesium 1.8.   History of atrial fibrillation -Patient is not on anticoagulation PTA.  Also not on any rate control medications.   Hypertension -Controlled off of meds   Chronic anemia: Hemoglobin stable in the high 9 g range   Hyponatremia Mild and clinically insignificant.  Resolved on day of discharge   Body mass index is 15.41 kg/m.     DVT prophylaxis: SCDs Start: 10/20/23 2356     Code Status: Full Code:  Family Communication: Discussed with patient's son at bedside. Disposition:  Medically stable for DC pending safe disposition.     Consultants:   Orthopedics  Procedures:   As noted above  Antimicrobials:      Subjective:  Mild soreness at surgical site.  Otherwise no acute findings as discussed with patient and her RN.  Objective:   Vitals:   10/26/23 1530 10/26/23 1935 10/27/23 0420 10/27/23 0758  BP: (!) 145/92 126/68 137/79 (!) 141/80  Pulse: 75  83 87 80  Resp: 17 20 16    Temp: 97.8 F (36.6 C) 98.5 F (36.9 C) 97.6 F (36.4 C) 98.4 F (36.9 C)  TempSrc: Oral Oral  Oral  SpO2: 98% 99% 100% 99%  Weight:      Height:        General exam: Elderly female, moderately built, thinly nourished sitting up comfortably in reclining chair eating breakfast.  Appears to be in good spirits.  States that as soon as she finishes breakfast, she is going to be  calling from a list for discharge options. Respiratory system: Clear to auscultation.  No increased work of breathing. Cardiovascular system: S1 & S2 heard, RRR. No JVD, murmurs, rubs, gallops or clicks. No pedal edema. Gastrointestinal system: Abdomen is nondistended, soft and nontender. No organomegaly or masses felt. Normal bowel sounds heard. Central nervous system: Alert and oriented. No focal neurological deficits. Extremities: Symmetric 5 x 5 power.  Left hip surgical site dressing clean and dry. Skin: No rashes, lesions or ulcers Psychiatry: Judgement and insight appear normal. Mood & affect appropriate.     Data Reviewed:   I have personally reviewed following labs and imaging studies   CBC: Recent Labs  Lab 10/22/23 0601 10/23/23 0954 10/24/23 0645  WBC 10.2 7.9 6.9  HGB 10.0* 9.8* 9.9*  HCT 30.9* 29.8* 30.4*  MCV 87.8 87.4 87.1  PLT 274 258 277    Basic Metabolic Panel: Recent Labs  Lab 10/20/23 1810 10/21/23 1906 10/22/23 0610 10/23/23 0954 10/24/23 0645  NA 139 134*  --  130* 138  K 3.0* 3.2*  --  3.9 4.4  CL 103 101  --  98 103  CO2 26 23  --  24 26  GLUCOSE 98 109*  --  102* 108*  BUN 20 13  --  13 12  CREATININE 0.58 0.46  --  0.81 0.44  CALCIUM 8.9 8.7*  --  8.7* 9.1  MG  --   --  1.8  --   --     Liver Function Tests: No results for input(s): "AST", "ALT", "ALKPHOS", "BILITOT", "PROT", "ALBUMIN" in the last 168 hours.  CBG: No results for input(s): "GLUCAP" in the last 168 hours.  Microbiology Studies:   Recent Results (from the past 240 hour(s))  Surgical pcr screen     Status: Abnormal   Collection Time: 10/21/23  8:04 AM   Specimen: Nasal Mucosa; Nasal Swab  Result Value Ref Range Status   MRSA, PCR NEGATIVE NEGATIVE Final   Staphylococcus aureus POSITIVE (A) NEGATIVE Final    Comment: (NOTE) The Xpert SA Assay (FDA approved for NASAL specimens in patients 67 years of age and older), is one component of a comprehensive surveillance  program. It is not intended to diagnose infection nor to guide or monitor treatment. Performed at [facility], [address] [place], [place] [address]     Radiology Studies:  No results found.  Scheduled Meds:    aspirin EC  81 mg Oral BID   mupirocin ointment   Nasal BID   senna  1 tablet Oral BID    Continuous Infusions:       LOS: 7 days     Marcellus Scott, MD,  FACP, [facility], [facility], [facility]   [facility] Ad[facility] [place]      To contact the attending provider between 7A-7P or the covering provider during after hours 7P-7A, please log into the web site [email or web address] and access using universal  [place] password for that web site. If you do not have the password, please call the hospital operator.  10/27/2023, 11:05 AM

## 2023-10-27 NOTE — TOC Progression Note (Signed)
Transition of Care [facility]) - Progression Note    Patient Details  Name: Shannon Burgess MRN: [id number] Date of Birth: [birth year]  Transition of Care [facility]) CM/SW Contact  Lorri Frederick, LCSW Phone Number: 10/27/2023, 3:39 PM  Clinical Narrative:   Pt and son reporting they do not have a hotel room, do not have funds as of now to get one.  Son is waiting for his employer to pick him up and planning to work today.  1530: CSW spoke with [facility] Supervisor [employer].  Hospital funds not available for housing for pt who has significant income.  Will have to refer them to shelter if no other options.  CSW and RNCM spoke with pt, informed her of the above.  Her son is working, she will call and tell him that will need plan by tomorrow.    Expected Discharge Plan: Home w Home Health Services Barriers to Discharge: No Barriers Identified  Expected Discharge Plan and Services In-house Referral: Clinical Social Work     Living arrangements for the past 2 months: Hotel/Motel Expected Discharge Date: 10/24/23               DME Arranged: Bedside commode, Walker rolling DME Agency: [employer] Date DME Agency Contacted: 10/24/23 Time DME Agency Contacted: 1030 Representative spoke with at DME Agency: Ian Malkin HH Arranged: PT, OT HH Agency: [facility] Date [facility] Agency Contacted: 10/24/23 Time HH Agency Contacted: 1029 Representative spoke with at [facility] Agency: Tresa Endo   Social Determinants of Health (SDOH) Interventions SDOH Screenings   Food Insecurity: Food Insecurity Present (10/21/2023)  Housing: High Risk (10/21/2023)  Transportation Needs: Unmet Transportation Needs (10/21/2023)  Utilities: At Risk (10/21/2023)  Depression (PHQ2-9): High Risk (04/28/2022)  Tobacco Use: High Risk (10/21/2023)    Readmission Risk Interventions     No data to display

## 2023-10-27 NOTE — Progress Notes (Signed)
Occupational Therapy Treatment Patient Details Name: Shannon Burgess MRN: [id number] DOB: Apr 21, 1951 Today's Date: 10/27/2023   History of present illness 72 y/o female presents to [facility] 10/20/23 after tripping and falling on L side. Pt w/ L intertrochanteric hip fx, s/p L IM nail 11/12. PMHx: polysubstance abuse (tobacco, cocaine, methamphetamine, heroin), CAD s/p percutaneous coronary angioplasty, HTN, HLD, depression, and anxiety with panic attacks.   OT comments  Patient progressing well.  She completes bed mobility from regular bed with independence, transfers with supervision (given cueing for hand placement), functional mobility using RW with supervision.  Improving independence with LB ADLs and only requiring min assist today, but continues to report she will have support for this at dc.  Demonstrated reverse step techniques for tub/walk in shower transfer, but pt does not practice at this time as breakfast arrived and she requested to eat.  Will follow acutely, continue to recommend HHOT at this time.        If plan is discharge home, recommend the following:  A little help with walking and/or transfers;Assist for transportation;Help with stairs or ramp for entrance;Assistance with cooking/housework;A little help with bathing/dressing/bathroom   Equipment Recommendations  BSC/3in1;Other (comment) (RW)    Recommendations for Other Services      Precautions / Restrictions Precautions Precautions: Fall Restrictions Weight Bearing Restrictions: Yes LLE Weight Bearing: Weight bearing as tolerated       Mobility Bed Mobility Overal bed mobility: Independent             General bed mobility comments: pt supporting L LE with UEs, from flat bed without rails    Transfers Overall transfer level: Needs assistance Equipment used: Rolling walker (2 wheels) Transfers: Sit to/from Stand Sit to Stand: Supervision           General transfer comment: for safeyt, cueing for hand  placement     Balance Overall balance assessment: Needs assistance Sitting-balance support: No upper extremity supported, Feet supported Sitting balance-Leahy Scale: Good     Standing balance support: Bilateral upper extremity supported, During functional activity, No upper extremity supported Standing balance-Leahy Scale: Fair Standing balance comment: relies on RW dynaimcally but able to engage in ADLs without UE support                           ADL either performed or assessed with clinical judgement   ADL Overall ADL's : Needs assistance/impaired     Grooming: Wash/dry hands;Supervision/safety;Standing               Lower Body Dressing: Minimal assistance;Sit to/from stand Lower Body Dressing Details (indicate cue type and reason): pt able to manage socks to pull up , but would need assist to fully don L sock Toilet Transfer: Ambulation;Supervision/safety;Rolling walker (2 wheels)   Toileting- Clothing Manipulation and Hygiene: Supervision/safety;Sit to/from stand       Functional mobility during ADLs: Supervision/safety;Rolling walker (2 wheels) General ADL Comments: patient requires    Extremity/Trunk Assessment              Vision       Perception     Praxis      Cognition Arousal: Alert Behavior During Therapy: WFL for tasks assessed/performed Overall Cognitive Status: Within Functional Limits for tasks assessed  Exercises      Shoulder Instructions       General Comments son at side    Pertinent Vitals/ Pain       Pain Assessment Pain Assessment: Faces Faces Pain Scale: Hurts little more Pain Location: L LE Pain Descriptors / Indicators: Aching, Discomfort, Grimacing Pain Intervention(s): Limited activity within patient's tolerance, Monitored during session, Repositioned  Home Living                                          Prior  Functioning/Environment              Frequency  Min 1X/week        Progress Toward Goals  OT Goals(current goals can now be found in the care plan section)  Progress towards OT goals: Progressing toward goals  Acute Rehab OT Goals Patient Stated Goal: find a place to go to OT Goal Formulation: With patient Time For Goal Achievement: 11/05/23 Potential to Achieve Goals: Good  Plan      Co-evaluation                 AM-PAC OT "6 Clicks" Daily Activity     Outcome Measure   Help from another person eating meals?: None Help from another person taking care of personal grooming?: A Little Help from another person toileting, which includes using toliet, bedpan, or urinal?: A Little Help from another person bathing (including washing, rinsing, drying)?: A Little Help from another person to put on and taking off regular upper body clothing?: A Little Help from another person to put on and taking off regular lower body clothing?: A Little 6 Click Score: 19    End of Session Equipment Utilized During Treatment: Rolling walker (2 wheels)  OT Visit Diagnosis: Other abnormalities of gait and mobility (R26.89);Muscle weakness (generalized) (M62.81);Pain;History of falling (Z91.81) Pain - Right/Left: Left Pain - part of body: Hip   Activity Tolerance Patient tolerated treatment well   Patient Left in chair;with call bell/phone within reach;with chair alarm set   Nurse Communication Mobility status        Time: 9563-8756 OT Time Calculation (min): 13 min  Charges: OT General Charges $OT Visit: 1 Visit OT Treatments $Self Care/Home Management : 8-22 mins  Barry Brunner, OT Acute Rehabilitation Services Office [phone]   Chancy Milroy 10/27/2023, 10:12 AM

## 2023-10-27 NOTE — Plan of Care (Signed)

## 2023-10-28 NOTE — Care Management Important Message (Signed)
Important Message  Patient Details  Name: Shannon Burgess MRN: [id number] Date of Birth: 09-09-1951   Important Message Given:  Yes - Medicare IM     Sherilyn Banker 10/28/2023, 2:05 PM

## 2023-10-28 NOTE — TOC Transition Note (Signed)
Transition of Care [facility]) - CM/SW Discharge Note   Patient Details  Name: Shannon Burgess MRN: [id number] Date of Birth: June 12, 1951  Transition of Care [facility]) CM/SW Contact:  Epifanio Lesches, RN Phone Number: 10/28/2023, 10:12 AM   Clinical Narrative:    Patient will DC to: home Anticipated DC date: 10/28/2023 Family notified: yes, son Transport by: car      -s/p Left hip cephalomedullary nailing, 11/12   Per MD patient ready for DC today. RN, patient, patient's son, and [facility] aware of DC plan. Son to assist with care once d/c. Son states will pick pt up by 4pm after work @ which time he would have secured a hotel for pt. Once name of hotel received NCM will f/u with home health agency with details. RW and BSC already delivered by [employer] to pt  and is @ bedside. Pt without RX med concerns. TOC pharmacy filling RX meds. Pt to receive meds prior to d/c . Post hospital f/u noted on AVS.  Transportation to home, cab voucher left by previous TOC member. Alyssa Majcher [facility] )[phone]   RNCM will sign off for now as intervention is no longer needed. Please consult [place] again if new needs arise.   Final next level of care: Home w Home Health Services Barriers to Discharge: No Barriers Identified   Patient Goals and CMS Choice   Choice offered to / list presented to : Patient  Discharge Placement                         Discharge Plan and Services Additional resources added to the After Visit Summary for   In-house Referral: Clinical Social Work              DME Arranged: Bedside commode, Walker rolling DME Agency: [employer] Date DME Agency Contacted: 10/24/23 Time DME Agency Contacted: 1030 Representative spoke with at DME Agency: Ian Malkin HH Arranged: PT, OT HH Agency: [facility] Date [facility] Agency Contacted: 10/24/23 Time HH Agency Contacted: 1029 Representative spoke with at [facility] Agency: Tresa Endo  Social Determinants of Health (SDOH)  Interventions SDOH Screenings   Food Insecurity: Food Insecurity Present (10/21/2023)  Housing: High Risk (10/21/2023)  Transportation Needs: Unmet Transportation Needs (10/21/2023)  Utilities: At Risk (10/21/2023)  Depression (PHQ2-9): High Risk (04/28/2022)  Tobacco Use: High Risk (10/21/2023)     Readmission Risk Interventions     No data to display

## 2023-10-28 NOTE — Progress Notes (Signed)
No charge progress note.  Patient was formally discharged on 10/24/2023 but has no safe place to go and has been in the hospital since then.  DME have already been delivered to bedside.  Discharge meds are available in [facility] pharmacy (however on final day of discharge, MD on that they may have to review the duration of aspirin that is being given for postop DVT prophylaxis).  When discharge location is determined by patient/family, TOC will arrange for home health services.  Patient without complaints.  Every day noted to be sitting up and painting using her several painting pens.  Vital signs and exam stable.  For rest of the hospital details, refer to progress note from 11/18 and discharge summary from 11/15.  Marcellus Scott, MD,  FACP, [facility], [facility]   [facility] Physician Advisor [place]     To contact the attending provider between 7A-7P or the covering provider during after hours 7P-7A, please log into the web site [email or web address] and access using universal [place] password for that web site. If you do not have the password, please call the hospital operator.

## 2023-10-28 NOTE — Progress Notes (Signed)
Physical Therapy Treatment Patient Details Name: Shannon Burgess MRN: [id number] DOB: Dec 21, 1950 Today's Date: 10/28/2023   History of Present Illness 72 y/o female presents to [facility] 10/20/23 after tripping and falling on L side. Pt w/ L intertrochanteric hip fx, s/p L IM nail 11/12. PMHx: polysubstance abuse (tobacco, cocaine, methamphetamine, heroin), CAD s/p percutaneous coronary angioplasty, HTN, HLD, depression, and anxiety with panic attacks.    PT Comments  Pt received sitting EOB, pt agreeable to therapy session with encouragement, with good participation and tolerance for gait, transfer and curb step negotiation with RW support. Pt needing Supervision for transfers/gait at most and CGA for stair sequencing for safety, no buckling. Gait distance limited due to discomfort but pt reports she has been up to bathroom/BSC? multiple times today unassisted. Pt continues to benefit from PT services to progress toward functional mobility goals.     If plan is discharge home, recommend the following: A little help with walking and/or transfers;A little help with bathing/dressing/bathroom   Can travel by private vehicle        Equipment Recommendations  Rolling walker (2 wheels)    Recommendations for Other Services       Precautions / Restrictions Precautions Precautions: Fall Restrictions Weight Bearing Restrictions: Yes LLE Weight Bearing: Weight bearing as tolerated     Mobility  Bed Mobility Overal bed mobility: Independent             General bed mobility comments: received up EOB    Transfers Overall transfer level: Needs assistance Equipment used: Rolling walker (2 wheels) Transfers: Sit to/from Stand Sit to Stand: Supervision           General transfer comment: intermittent cues for better UE placement but good carryover of cues.    Ambulation/Gait Ambulation/Gait assistance: Supervision Gait Distance (Feet): 100 Feet Assistive device: Rolling walker (2  wheels) Gait Pattern/deviations: Step-to pattern, Decreased step length - right, Decreased stance time - right, Step-through pattern       General Gait Details: Improved step length/height this date, pt does better with shoes donned, min cues for better heel strike and proximity to RW; no buckling   Stairs Stairs: Yes Stairs assistance: Contact guard assist Stair Management: Step to pattern, Forwards, With walker Number of Stairs: 1 General stair comments: single curb step 4" height x3 reps, no buckling, cues for step sequencing but needs reminders for sequencing 2/3 reps   Wheelchair Mobility     Tilt Bed    Modified Rankin (Stroke Patients Only)       Balance Overall balance assessment: Needs assistance Sitting-balance support: No upper extremity supported, Feet supported Sitting balance-Leahy Scale: Good     Standing balance support: Bilateral upper extremity supported, During functional activity, No upper extremity supported Standing balance-Leahy Scale: Fair Standing balance comment: relies on RW dynamically but able to engage in ADLs without UE support                            Cognition Arousal: Alert Behavior During Therapy: [facility] for tasks assessed/performed Overall Cognitive Status: Within Functional Limits for tasks assessed                                          Exercises General Exercises - Lower Extremity Long Arc Quad: AROM, Both, 10 reps, Seated    General Comments General comments (skin  integrity, edema, etc.): Pt able to reach down and don her pants without assist      Pertinent Vitals/Pain Pain Assessment Pain Assessment: Faces Faces Pain Scale: Hurts little more Pain Location: L LE Pain Descriptors / Indicators: Aching, Discomfort, Grimacing Pain Intervention(s): Monitored during session    Home Living                          Prior Function            PT Goals (current goals can now be  found in the care plan section) Acute Rehab PT Goals PT Goal Formulation: With patient Time For Goal Achievement: 11/05/23 Progress towards PT goals: Progressing toward goals    Frequency    Min 1X/week      PT Plan      Co-evaluation              AM-PAC PT "6 Clicks" Mobility   Outcome Measure  Help needed turning from your back to your side while in a flat bed without using bedrails?: None Help needed moving from lying on your back to sitting on the side of a flat bed without using bedrails?: None Help needed moving to and from a bed to a chair (including a wheelchair)?: A Little Help needed standing up from a chair using your arms (e.g., wheelchair or bedside chair)?: A Little Help needed to walk in hospital room?: A Little Help needed climbing 3-5 steps with a railing? : A Little 6 Click Score: 20    End of Session Equipment Utilized During Treatment: Gait belt Activity Tolerance: Patient tolerated treatment well Patient left: in chair;with call bell/phone within reach;with chair alarm set;with family/visitor present (son in room) Nurse Communication: Mobility status PT Visit Diagnosis: Unsteadiness on feet (R26.81);Repeated falls (R29.6);Muscle weakness (generalized) (M62.81)     Time: 5284-1324 PT Time Calculation (min) (ACUTE ONLY): 19 min  Charges:    $Gait Training: 8-22 mins PT General Charges $$ ACUTE PT VISIT: 1 Visit                     Jalesia Loudenslager P., PTA Acute Rehabilitation Services Secure Chat Preferred 9a-5:30pm Office: [phone]    Angus Palms 10/28/2023, 5:46 PM

## 2023-10-28 NOTE — Plan of Care (Signed)

## 2023-10-29 DIAGNOSIS — S72002A Fracture of unspecified part of neck of left femur, initial encounter for closed fracture: Secondary | ICD-10-CM | POA: Diagnosis not present

## 2023-10-29 NOTE — Progress Notes (Signed)
Occupational Therapy Treatment Patient Details Name: Shannon Burgess MRN: [id number] DOB: 02-Mar-1951 Today's Date: 10/29/2023   History of present illness 72 y/o female presents to [facility] 10/20/23 after tripping and falling on L side. Pt w/ L intertrochanteric hip fx, s/p L IM nail 11/12. PMHx: polysubstance abuse (tobacco, cocaine, methamphetamine, heroin), CAD s/p percutaneous coronary angioplasty, HTN, HLD, depression, and anxiety with panic attacks.   OT comments  Patient reports using commode with RW without assist.  Demonstrates difficulty with L LE clothing mgmt, educated on use of reacher to don/doff socks and pants (increased effort with L Sock but able to manage with increased time). Will follow acutely but pt has progressed past HHOT needs, updated to no OT follow up.        If plan is discharge home, recommend the following:  Assist for transportation;Help with stairs or ramp for entrance;Assistance with cooking/housework   Equipment Recommendations  BSC/3in1;Other (comment) (RW)    Recommendations for Other Services      Precautions / Restrictions Precautions Precautions: Fall Restrictions Weight Bearing Restrictions: Yes LLE Weight Bearing: Weight bearing as tolerated       Mobility Bed Mobility Overal bed mobility: Independent             General bed mobility comments: increased effort to bring L LE into bed but no assist required, pt using sweatpants to assist    Transfers Overall transfer level: Needs assistance Equipment used: Rolling walker (2 wheels) Transfers: Sit to/from Stand Sit to Stand: Supervision           General transfer comment: safety     Balance Overall balance assessment: Needs assistance Sitting-balance support: No upper extremity supported, Feet supported Sitting balance-Leahy Scale: Good     Standing balance support: Bilateral upper extremity supported, During functional activity, No upper extremity supported Standing  balance-Leahy Scale: Fair                             ADL either performed or assessed with clinical judgement   ADL Overall ADL's : Needs assistance/impaired                     Lower Body Dressing: Supervision/safety;With adaptive equipment;Sit to/from stand   Toilet Transfer: Modified Independent;Ambulation;Rolling walker (2 wheels) Toilet Transfer Details (indicate cue type and reason): pt reports transferring to commode using RW without assist         Functional mobility during ADLs: Supervision/safety;Rolling walker (2 wheels)      Extremity/Trunk Assessment              Vision       Perception     Praxis      Cognition Arousal: Alert Behavior During Therapy: WFL for tasks assessed/performed Overall Cognitive Status: Within Functional Limits for tasks assessed                                          Exercises      Shoulder Instructions       General Comments issued reacher as pt is homeless and needs to be as independent as possible. pt able to use reacher to maximize independence for LB ADLs    Pertinent Vitals/ Pain       Pain Assessment Pain Assessment: Faces Faces Pain Scale: Hurts a little bit Pain Location: L LE Pain  Descriptors / Indicators: Aching, Discomfort Pain Intervention(s): Monitored during session  Home Living                                          Prior Functioning/Environment              Frequency  Min 1X/week        Progress Toward Goals  OT Goals(current goals can now be found in the care plan section)  Progress towards OT goals: Progressing toward goals  Acute Rehab OT Goals Patient Stated Goal: get out of here today OT Goal Formulation: With patient Time For Goal Achievement: 11/05/23 Potential to Achieve Goals: Good  Plan      Co-evaluation                 AM-PAC OT "6 Clicks" Daily Activity     Outcome Measure   Help from another  person eating meals?: None Help from another person taking care of personal grooming?: None Help from another person toileting, which includes using toliet, bedpan, or urinal?: None Help from another person bathing (including washing, rinsing, drying)?: A Little Help from another person to put on and taking off regular upper body clothing?: None Help from another person to put on and taking off regular lower body clothing?: A Little 6 Click Score: 22    End of Session Equipment Utilized During Treatment: Rolling walker (2 wheels)  OT Visit Diagnosis: Other abnormalities of gait and mobility (R26.89);Muscle weakness (generalized) (M62.81);Pain;History of falling (Z91.81) Pain - Right/Left: Left Pain - part of body: Hip   Activity Tolerance Patient tolerated treatment well   Patient Left in bed;with call bell/phone within reach;with family/visitor present   Nurse Communication Mobility status        Time: 1610-9604 OT Time Calculation (min): 14 min  Charges: OT General Charges $OT Visit: 1 Visit OT Treatments $Self Care/Home Management : 8-22 mins  Barry Brunner, OT Acute Rehabilitation Services Office [phone]   Shannon Burgess 10/29/2023, 9:38 AM

## 2023-10-29 NOTE — Progress Notes (Signed)
PROGRESS NOTE    Shannon Burgess  WUJ:[id number] DOB: 10-17-1951 DOA: 10/20/2023 PCP: Pcp, No    Brief Narrative:   Shannon Burgess is a 72 y.o. female with past medical history significant for HTN, atrial fibrillation not on anticoagulation, CAD s/p stents x2,HLD, MDD, polysubstance abuse, homelessness who presents following mechanical fall at a hotel, was unable to get up for 18 hours, was able to crawl for help. Had left-sided hip and shoulder pain. Came to ED. Pelvic and femur x-ray showed left hip intertrochanteric fracture. Orthopedic surgery was consulted and patient underwent left hip cephalomedullary nailing on 11/13.   Patient was discharged on 10/24/2023, but given patient without safe disposition as previously in a hotel room continues to remain in the hospital.  Patient continues to remain stable for discharge per discharge summary dated 10/24/2023.  Assessment & Plan:   Left hip intertrochanteric fracture Patient presenting to ED following fall and inability to ambulate.  Complaining of left-sided hip and shoulder pain.  Workup in the ED notable for left hip intertrochanteric fracture.  Orthopedics was consulted and patient underwent left hip cephalic medullary nailing on 95/62/1308 by Dr. Hulda Humphrey.  Patient was seen by PT and OT with recommendation of home health on discharge. -- WBAT LLE -- Aspirin 81 mg p.o. twice daily for postoperative DVT prophylaxis per orthopedics -- outpatient follow-up with orthopedics, Dr. Hulda Humphrey in 2 weeks for staple removal and x-ray of left hip. -- Remains stable for discharge, further.  TOC   CAD S/P percutaneous coronary angioplasty Underwent stent placement at [facility] x 2 at in 2012 but no records.  Last echocardiogram in 04/2022 with EF 60 to 65%, moderate LVH, grade 1 diastolic dysfunction, mild to moderate mitral valve regurgitation, moderate to severe tricuspid valve regurgitation.  Denies anginal symptoms.  Has been previously prescribed aspirin and  Lipitor but currently not taking it. -- Continue aspirin as above -- Outpatient follow-up with PCP/cardiology   Hypokalemia Repleted during hospitalization.   History of atrial fibrillation Patient is not on anticoagulation or rate controlling medications prior to admission.    Hypertension Remains controlled off of antihypertensives.   Chronic anemia: Hemoglobin stable in the high 9 g range   Hyponatremia Mild and clinically insignificant.  Resolved on day of discharge   Moderate protein, malnutrition Body mass index is 15.41 kg/m.  Courage increase oral intake, supplementation.   DVT prophylaxis: SCDs Start: 10/20/23 2356    Code Status: Full Code Family Communication: Updated son present at bedside this morning  Disposition Plan:  Level of care: Med-Surg Status is: Inpatient Remains inpatient appropriate because: Remains medically stable for discharge, further per [facility]    Consultants:  Orthopedics, Dr. Hulda Humphrey  Procedures:  Left cephalomedullary nail, 11/13, Dr. Hulda Humphrey  Antimicrobials:  Perioperative cefazolin   Subjective: Patient seen examined bedside, resting calmly.  Lying in bed.  Pain controlled.  Son present at bedside.  States hopeful for discharge later today once receives funding to return to a hotel.  Discussed with patient and son needs to update social work in order to have home health finalized at hotel location.  No other specific questions, concerns or complaints at this time.  Denies headache, no dizziness, no chest pain, no palpitations, no shortness of breath, no abdominal pain, no fever/chills/night sweats, no nausea/emesis diarrhea, no focal weakness, no fatigue, no paresthesias.  No acute events overnight per nursing staff.  Objective: Vitals:   10/28/23 1425 10/28/23 1943 10/29/23 0334 10/29/23 0735  BP: (!) 156/89 (!) 164/90 Marland Kitchen)  153/76 (!) 142/81  Pulse: 96 94 83 78  Resp: 16 18 18 17   Temp: 98.1 F (36.7 C) 98.4 F (36.9 C) (!) 97.4 F  (36.3 C) 98.3 F (36.8 C)  TempSrc: Oral Oral Oral Oral  SpO2: 98% 98% 100% 100%  Weight:      Height:       No intake or output data in the 24 hours ending 10/29/23 1125 Filed Weights   10/20/23 1650 10/21/23 0156  Weight: 39.5 kg 39.5 kg    Examination:  Physical Exam: GEN: NAD, alert and oriented x 3, chronically ill appearance, appears older than stated age HEENT: NCAT, PERRL, EOMI, sclera clear, MMM PULM: CTAB w/o wheezes/crackles, normal respiratory effort, on room air CV: RRR w/o M/G/R GI: abd soft, NTND, NABS, no R/G/M MSK: no peripheral edema, muscle strength globally intact 5/5 bilateral upper/lower extremities NEURO: CN II-XII intact, no focal deficits, sensation to light touch intact PSYCH: normal mood/affect Integumentary: Left leg surgical site noted with dressing in place, clean/dry/intact, otherwise no other concerning rashes/lesions/wounds noted on exposed skin surfaces.    Data Reviewed: I have personally reviewed following labs and imaging studies  CBC: Recent Labs  Lab 10/23/23 0954 10/24/23 0645  WBC 7.9 6.9  HGB 9.8* 9.9*  HCT 29.8* 30.4*  MCV 87.4 87.1  PLT 258 277   Basic Metabolic Panel: Recent Labs  Lab 10/23/23 0954 10/24/23 0645  NA 130* 138  K 3.9 4.4  CL 98 103  CO2 24 26  GLUCOSE 102* 108*  BUN 13 12  CREATININE 0.81 0.44  CALCIUM 8.7* 9.1   GFR: Estimated Creatinine Clearance: 40.8 mL/min (by C-G formula based on SCr of 0.44 mg/dL). Liver Function Tests: No results for input(s): "AST", "ALT", "ALKPHOS", "BILITOT", "PROT", "ALBUMIN" in the last 168 hours. No results for input(s): "LIPASE", "AMYLASE" in the last 168 hours. No results for input(s): "AMMONIA" in the last 168 hours. Coagulation Profile: No results for input(s): "INR", "PROTIME" in the last 168 hours. Cardiac Enzymes: No results for input(s): "CKTOTAL", "CKMB", "CKMBINDEX", "TROPONINI" in the last 168 hours. BNP (last 3 results) No results for input(s):  "PROBNP" in the last 8760 hours. HbA1C: No results for input(s): "HGBA1C" in the last 72 hours. CBG: No results for input(s): "GLUCAP" in the last 168 hours. Lipid Profile: No results for input(s): "CHOL", "HDL", "LDLCALC", "TRIG", "CHOLHDL", "LDLDIRECT" in the last 72 hours. Thyroid Function Tests: No results for input(s): "TSH", "T4TOTAL", "FREET4", "T3FREE", "THYROIDAB" in the last 72 hours. Anemia Panel: No results for input(s): "VITAMINB12", "FOLATE", "FERRITIN", "TIBC", "IRON", "RETICCTPCT" in the last 72 hours. Sepsis Labs: No results for input(s): "PROCALCITON", "LATICACIDVEN" in the last 168 hours.  Recent Results (from the past 240 hour(s))  Surgical pcr screen     Status: Abnormal   Collection Time: 10/21/23  8:04 AM   Specimen: Nasal Mucosa; Nasal Swab  Result Value Ref Range Status   MRSA, PCR NEGATIVE NEGATIVE Final   Staphylococcus aureus POSITIVE (A) NEGATIVE Final    Comment: (NOTE) The Xpert SA Assay (FDA approved for NASAL specimens in patients 70 years of age and older), is one component of a comprehensive surveillance program. It is not intended to diagnose infection nor to guide or monitor treatment. Performed at [facility], [address], [place], [place] [address]          Radiology Studies: No results found.      Scheduled Meds:  aspirin EC  81 mg Oral BID  mupirocin ointment   Nasal BID   senna  1 tablet Oral BID   Continuous Infusions:   LOS: 9 days    Time spent: 41 minutes spent on chart review, discussion with nursing staff, consultants, updating family and interview/physical exam; more than 50% of that time was spent in counseling and/or coordination of care.    Alvira Philips [place], DO [facility] Available via Epic secure chat 7am-7pm After these hours, please refer to coverage provider listed on [email or web address] 10/29/2023, 11:25 AM

## 2023-10-29 NOTE — TOC Transition Note (Signed)
Transition of Care [facility]) - CM/SW Discharge Note   Patient Details  Name: Shannon Burgess MRN: [id number] Date of Birth: 12/07/1951  Transition of Care [facility]) CM/SW Contact:  Epifanio Lesches, RN Phone Number: 10/29/2023, 10:40 AM   Clinical Narrative:        - -s/p Left hip cephalomedullary nailing, 11/12  Per son pt will d/c today to [facility],  [address], [place], [place] [address], [phone]. NCM made [facility] aware of address. DME @ bedside. TOC pharmacy filled meds and nurse to provide prior to d/c.Pt without RX med concerns.  Taxi Voucher provided for transportation to [facility].  Post hospital f/u noted on AVS. Pt's # [phone].  Final next level of care: Home w Home Health Services Barriers to Discharge: No Barriers Identified   Patient Goals and CMS Choice   Choice offered to / list presented to : Patient  Discharge Placement                         Discharge Plan and Services Additional resources added to the After Visit Summary for   In-house Referral: Clinical Social Work              DME Arranged: Bedside commode, Walker rolling DME Agency: [employer] Date DME Agency Contacted: 10/24/23 Time DME Agency Contacted: 1030 Representative spoke with at DME Agency: Ian Malkin HH Arranged: PT, OT HH Agency: [facility] Date [facility] Agency Contacted: 10/24/23 Time HH Agency Contacted: 1029 Representative spoke with at [facility] Agency: Tresa Endo  Social Determinants of Health (SDOH) Interventions SDOH Screenings   Food Insecurity: Food Insecurity Present (10/21/2023)  Housing: High Risk (10/21/2023)  Transportation Needs: Unmet Transportation Needs (10/21/2023)  Utilities: At Risk (10/21/2023)  Depression (PHQ2-9): High Risk (04/28/2022)  Tobacco Use: High Risk (10/21/2023)     Readmission Risk Interventions     No data to display

## 2023-11-17 ENCOUNTER — Ambulatory Visit: Payer: Self-pay | Admitting: Student

## 2024-01-10 ENCOUNTER — Encounter (HOSPITAL_COMMUNITY): Payer: Self-pay

## 2024-01-10 ENCOUNTER — Inpatient Hospital Stay (HOSPITAL_COMMUNITY)
Admission: EM | Admit: 2024-01-10 | Discharge: 2024-02-07 | DRG: 871 | Disposition: E | Payer: 59 | Attending: Pulmonary Disease | Admitting: Pulmonary Disease

## 2024-01-10 ENCOUNTER — Emergency Department (HOSPITAL_COMMUNITY): Payer: 59

## 2024-01-10 DIAGNOSIS — R54 Age-related physical debility: Secondary | ICD-10-CM | POA: Diagnosis present

## 2024-01-10 DIAGNOSIS — R197 Diarrhea, unspecified: Secondary | ICD-10-CM | POA: Diagnosis present

## 2024-01-10 DIAGNOSIS — I10 Essential (primary) hypertension: Secondary | ICD-10-CM | POA: Diagnosis present

## 2024-01-10 DIAGNOSIS — Z5941 Food insecurity: Secondary | ICD-10-CM

## 2024-01-10 DIAGNOSIS — F1110 Opioid abuse, uncomplicated: Secondary | ICD-10-CM | POA: Diagnosis present

## 2024-01-10 DIAGNOSIS — I4891 Unspecified atrial fibrillation: Secondary | ICD-10-CM | POA: Diagnosis present

## 2024-01-10 DIAGNOSIS — Z955 Presence of coronary angioplasty implant and graft: Secondary | ICD-10-CM

## 2024-01-10 DIAGNOSIS — I462 Cardiac arrest due to underlying cardiac condition: Secondary | ICD-10-CM | POA: Diagnosis not present

## 2024-01-10 DIAGNOSIS — J189 Pneumonia, unspecified organism: Secondary | ICD-10-CM

## 2024-01-10 DIAGNOSIS — F17210 Nicotine dependence, cigarettes, uncomplicated: Secondary | ICD-10-CM | POA: Diagnosis present

## 2024-01-10 DIAGNOSIS — F329 Major depressive disorder, single episode, unspecified: Secondary | ICD-10-CM | POA: Diagnosis present

## 2024-01-10 DIAGNOSIS — Z8249 Family history of ischemic heart disease and other diseases of the circulatory system: Secondary | ICD-10-CM

## 2024-01-10 DIAGNOSIS — D72819 Decreased white blood cell count, unspecified: Secondary | ICD-10-CM | POA: Diagnosis present

## 2024-01-10 DIAGNOSIS — F1410 Cocaine abuse, uncomplicated: Secondary | ICD-10-CM | POA: Diagnosis present

## 2024-01-10 DIAGNOSIS — Z7982 Long term (current) use of aspirin: Secondary | ICD-10-CM

## 2024-01-10 DIAGNOSIS — J101 Influenza due to other identified influenza virus with other respiratory manifestations: Secondary | ICD-10-CM | POA: Diagnosis present

## 2024-01-10 DIAGNOSIS — Z681 Body mass index (BMI) 19 or less, adult: Secondary | ICD-10-CM

## 2024-01-10 DIAGNOSIS — J9601 Acute respiratory failure with hypoxia: Secondary | ICD-10-CM | POA: Diagnosis not present

## 2024-01-10 DIAGNOSIS — A4189 Other specified sepsis: Secondary | ICD-10-CM | POA: Diagnosis not present

## 2024-01-10 DIAGNOSIS — E785 Hyperlipidemia, unspecified: Secondary | ICD-10-CM | POA: Diagnosis present

## 2024-01-10 DIAGNOSIS — Z1152 Encounter for screening for COVID-19: Secondary | ICD-10-CM

## 2024-01-10 DIAGNOSIS — R6520 Severe sepsis without septic shock: Secondary | ICD-10-CM | POA: Diagnosis present

## 2024-01-10 DIAGNOSIS — F1510 Other stimulant abuse, uncomplicated: Secondary | ICD-10-CM | POA: Diagnosis present

## 2024-01-10 DIAGNOSIS — Z66 Do not resuscitate: Secondary | ICD-10-CM | POA: Diagnosis present

## 2024-01-10 DIAGNOSIS — N179 Acute kidney failure, unspecified: Secondary | ICD-10-CM | POA: Diagnosis present

## 2024-01-10 DIAGNOSIS — J1000 Influenza due to other identified influenza virus with unspecified type of pneumonia: Secondary | ICD-10-CM | POA: Diagnosis present

## 2024-01-10 DIAGNOSIS — I4901 Ventricular fibrillation: Secondary | ICD-10-CM | POA: Diagnosis not present

## 2024-01-10 DIAGNOSIS — Z5982 Transportation insecurity: Secondary | ICD-10-CM

## 2024-01-10 DIAGNOSIS — I2510 Atherosclerotic heart disease of native coronary artery without angina pectoris: Secondary | ICD-10-CM | POA: Diagnosis present

## 2024-01-10 DIAGNOSIS — Z91148 Patient's other noncompliance with medication regimen for other reason: Secondary | ICD-10-CM

## 2024-01-10 DIAGNOSIS — J9690 Respiratory failure, unspecified, unspecified whether with hypoxia or hypercapnia: Secondary | ICD-10-CM | POA: Diagnosis present

## 2024-01-10 MED ORDER — SUCCINYLCHOLINE CHLORIDE 200 MG/10ML IV SOSY
100.0000 mg | PREFILLED_SYRINGE | Freq: Once | INTRAVENOUS | Status: AC
  Administered 2024-01-10: 100 mg via INTRAVENOUS

## 2024-01-10 MED ORDER — ETOMIDATE 2 MG/ML IV SOLN: 20.0000 mg | Freq: Once | INTRAVENOUS | Status: AC

## 2024-01-10 MED ORDER — ROCURONIUM BROMIDE 10 MG/ML (PF) SYRINGE
PREFILLED_SYRINGE | INTRAVENOUS | Status: AC
  Filled 2024-01-10: qty 10

## 2024-01-10 MED ORDER — SUCCINYLCHOLINE CHLORIDE 200 MG/10ML IV SOSY
PREFILLED_SYRINGE | INTRAVENOUS | Status: AC
  Filled 2024-01-10: qty 10

## 2024-01-10 MED ORDER — PROPOFOL 1000 MG/100ML IV EMUL
INTRAVENOUS | Status: AC
  Filled 2024-01-10: qty 100

## 2024-01-10 MED ORDER — PROPOFOL 1000 MG/100ML IV EMUL: 5.0000 ug/kg/min | INTRAVENOUS | Status: DC

## 2024-01-10 MED ORDER — FENTANYL CITRATE PF 50 MCG/ML IJ SOSY
50.0000 ug | PREFILLED_SYRINGE | Freq: Once | INTRAMUSCULAR | Status: AC
  Administered 2024-01-10: 50 ug via INTRAVENOUS
  Filled 2024-01-10: qty 1

## 2024-01-10 MED ORDER — ETOMIDATE 2 MG/ML IV SOLN
INTRAVENOUS | Status: AC
  Administered 2024-01-10: 20 mg via INTRAVENOUS
  Filled 2024-01-10: qty 10

## 2024-01-10 NOTE — Progress Notes (Signed)
RT attempted ABG twice but without any success. MD aware.

## 2024-01-10 NOTE — ED Triage Notes (Signed)
Pt's brother called EMS bc pt has had a cough for 2 weeks with increase respiratory distress and was worried about pneumonia. Pt has c/o of abdominal pain for several days with diarrhea. Pt states it moves up into her flank.   EMS 88 % O2 on room air 15L non rebreather 93-94% Cbg 74

## 2024-01-10 NOTE — ED Provider Notes (Signed)
[place] EMERGENCY DEPARTMENT AT [facility] Provider Note   CSN: [id number] Arrival date & time: 01/10/24  2247     History {Add pertinent medical, surgical, social history, OB history to HPI:1} Chief Complaint  Patient presents with   Respiratory Distress   Abdominal Pain    Shannon Burgess is a 73 y.o. female.  Patient presents to the emergency department with complaints of difficulty breathing, right sided pain.  Patient comes to the ED by EMS.  Brother called because he was worried about her breathing.  At arrival, patient moaning, reporting that she has diffuse right-sided pain.       Home Medications Prior to Admission medications   Medication Sig Start Date End Date Taking? Authorizing Provider  aspirin EC 81 MG tablet Take 1 tablet (81 mg total) by mouth 2 (two) times daily after a meal for 2 weeks then once a day for 2 weeks for DVT prevention. 10/24/23   Elodia Florence, PA-C  HYDROcodone-acetaminophen (NORCO/VICODIN) 5-325 MG tablet Take 1 tablet by mouth every 6 (six) hours as needed for moderate pain (pain score 4-6) or severe pain (pain score 7-10) (post op pain). 10/24/23   Elodia Florence, PA-C  polyethylene glycol (MIRALAX / GLYCOLAX) 17 g packet Take 17 g by mouth daily as needed for mild constipation. Patient not taking: Reported on 10/20/2023 05/04/22   Marolyn Haller, MD      Allergies    Patient has no known allergies.    Review of Systems   Review of Systems  Physical Exam Updated Vital Signs BP (!) 137/97   Pulse 85   Temp (!) 97.4 F (36.3 C) (Axillary)   Resp 13   SpO2 92%  Physical Exam Vitals and nursing note reviewed.  Constitutional:      Appearance: She is underweight. She is ill-appearing.  HENT:     Head: Normocephalic and atraumatic.     Mouth/Throat:     Mouth: Mucous membranes are moist.  Eyes:     General: Vision grossly intact. Gaze aligned appropriately.     Extraocular Movements: Extraocular movements intact.      Conjunctiva/sclera: Conjunctivae normal.  Cardiovascular:     Rate and Rhythm: Normal rate and regular rhythm.     Pulses: Normal pulses.     Heart sounds: Normal heart sounds, S1 normal and S2 normal. No murmur heard.    No friction rub. No gallop.  Pulmonary:     Effort: Pulmonary effort is normal. No respiratory distress.     Breath sounds: Normal breath sounds.  Chest:     Chest wall: Tenderness (right side) present.  Abdominal:     General: Bowel sounds are normal.     Palpations: Abdomen is soft.     Tenderness: There is abdominal tenderness in the right upper quadrant and right lower quadrant. There is no guarding or rebound.     Hernia: No hernia is present.  Musculoskeletal:        General: No swelling.     Cervical back: Full passive range of motion without pain, normal range of motion and neck supple. No spinous process tenderness or muscular tenderness. Normal range of motion.     Right lower leg: No edema.     Left lower leg: No edema.  Skin:    General: Skin is warm and dry.     Capillary Refill: Capillary refill takes less than 2 seconds.     Findings: No ecchymosis, erythema, rash or wound.  Neurological:  General: No focal deficit present.     Mental Status: She is alert and oriented to person, place, and time.     GCS: GCS eye subscore is 4. GCS verbal subscore is 5. GCS motor subscore is 6.     Cranial Nerves: Cranial nerves 2-12 are intact.     Sensory: Sensation is intact.     Motor: Motor function is intact.     Coordination: Coordination is intact.  Psychiatric:        Attention and Perception: Attention normal.        Mood and Affect: Mood normal.        Speech: Speech normal.        Behavior: Behavior normal.     ED Results / Procedures / Treatments   Labs (all labs ordered are listed, but only abnormal results are displayed) Labs Reviewed  CULTURE, BLOOD (ROUTINE X 2)  CULTURE, BLOOD (ROUTINE X 2)  RESP PANEL BY RT-PCR (RSV, FLU A&B, COVID)   RVPGX2  COMPREHENSIVE METABOLIC PANEL  CBC WITH DIFFERENTIAL/PLATELET  PROTIME-INR  APTT  LIPASE, BLOOD  BRAIN NATRIURETIC PEPTIDE  URINALYSIS, W/ REFLEX TO CULTURE (INFECTION SUSPECTED)  BLOOD GAS, ARTERIAL  RAPID URINE DRUG SCREEN, HOSP PERFORMED  I-STAT CG4 LACTIC ACID, ED  TROPONIN I (HIGH SENSITIVITY)    EKG EKG Interpretation Date/Time:  Saturday January 10 2024 23:22:33 EST Ventricular Rate:  91 PR Interval:  154 QRS Duration:  106 QT Interval:  381 QTC Calculation: 467 R Axis:   76  Text Interpretation: Sinus rhythm Right atrial enlargement Probable left ventricular hypertrophy No significant change since last tracing Confirmed by Gilda Crease [phone]) on 01/10/2024 11:25:44 PM  Radiology No results found.  Procedures Procedures  {Document cardiac monitor, telemetry assessment procedure when appropriate:1}  Medications Ordered in ED Medications  fentaNYL (SUBLIMAZE) injection 50 mcg (50 mcg Intravenous Given 01/10/24 2310)    ED Course/ Medical Decision Making/ A&P   {   Click here for ABCD2, HEART and other calculatorsREFRESH Note before signing :1}                              Medical Decision Making Amount and/or Complexity of Data Reviewed Labs: ordered. Radiology: ordered.  Risk Prescription drug management.   ***  {Document critical care time when appropriate:1} {Document review of labs and clinical decision tools ie heart score, Chads2Vasc2 etc:1}  {Document your independent review of radiology images, and any outside records:1} {Document your discussion with family members, caretakers, and with consultants:1} {Document social determinants of health affecting pt's care:1} {Document your decision making why or why not admission, treatments were needed:1} Final Clinical Impression(s) / ED Diagnoses Final diagnoses:  None    Rx / DC Orders ED Discharge Orders     None

## 2024-01-11 ENCOUNTER — Emergency Department (HOSPITAL_COMMUNITY): Payer: 59

## 2024-01-11 DIAGNOSIS — I2510 Atherosclerotic heart disease of native coronary artery without angina pectoris: Secondary | ICD-10-CM | POA: Diagnosis present

## 2024-01-11 DIAGNOSIS — N179 Acute kidney failure, unspecified: Secondary | ICD-10-CM | POA: Diagnosis present

## 2024-01-11 DIAGNOSIS — R680 Hypothermia, not associated with low environmental temperature: Secondary | ICD-10-CM | POA: Diagnosis not present

## 2024-01-11 DIAGNOSIS — Z681 Body mass index (BMI) 19 or less, adult: Secondary | ICD-10-CM | POA: Diagnosis not present

## 2024-01-11 DIAGNOSIS — I4901 Ventricular fibrillation: Secondary | ICD-10-CM | POA: Diagnosis not present

## 2024-01-11 DIAGNOSIS — Z1152 Encounter for screening for COVID-19: Secondary | ICD-10-CM | POA: Diagnosis not present

## 2024-01-11 DIAGNOSIS — J9690 Respiratory failure, unspecified, unspecified whether with hypoxia or hypercapnia: Secondary | ICD-10-CM | POA: Diagnosis present

## 2024-01-11 DIAGNOSIS — Z8249 Family history of ischemic heart disease and other diseases of the circulatory system: Secondary | ICD-10-CM | POA: Diagnosis not present

## 2024-01-11 DIAGNOSIS — J101 Influenza due to other identified influenza virus with other respiratory manifestations: Secondary | ICD-10-CM | POA: Diagnosis present

## 2024-01-11 DIAGNOSIS — J09X1 Influenza due to identified novel influenza A virus with pneumonia: Secondary | ICD-10-CM | POA: Diagnosis not present

## 2024-01-11 DIAGNOSIS — F1510 Other stimulant abuse, uncomplicated: Secondary | ICD-10-CM | POA: Diagnosis present

## 2024-01-11 DIAGNOSIS — F1110 Opioid abuse, uncomplicated: Secondary | ICD-10-CM | POA: Diagnosis present

## 2024-01-11 DIAGNOSIS — E785 Hyperlipidemia, unspecified: Secondary | ICD-10-CM | POA: Diagnosis present

## 2024-01-11 DIAGNOSIS — R197 Diarrhea, unspecified: Secondary | ICD-10-CM | POA: Diagnosis present

## 2024-01-11 DIAGNOSIS — D72819 Decreased white blood cell count, unspecified: Secondary | ICD-10-CM | POA: Diagnosis present

## 2024-01-11 DIAGNOSIS — I10 Essential (primary) hypertension: Secondary | ICD-10-CM | POA: Diagnosis present

## 2024-01-11 DIAGNOSIS — J1000 Influenza due to other identified influenza virus with unspecified type of pneumonia: Secondary | ICD-10-CM | POA: Diagnosis present

## 2024-01-11 DIAGNOSIS — R6520 Severe sepsis without septic shock: Secondary | ICD-10-CM | POA: Diagnosis present

## 2024-01-11 DIAGNOSIS — A4189 Other specified sepsis: Secondary | ICD-10-CM | POA: Diagnosis present

## 2024-01-11 DIAGNOSIS — J9601 Acute respiratory failure with hypoxia: Secondary | ICD-10-CM | POA: Diagnosis present

## 2024-01-11 DIAGNOSIS — I4891 Unspecified atrial fibrillation: Secondary | ICD-10-CM | POA: Diagnosis present

## 2024-01-11 DIAGNOSIS — F17210 Nicotine dependence, cigarettes, uncomplicated: Secondary | ICD-10-CM | POA: Diagnosis present

## 2024-01-11 DIAGNOSIS — Z955 Presence of coronary angioplasty implant and graft: Secondary | ICD-10-CM | POA: Diagnosis not present

## 2024-01-11 DIAGNOSIS — I462 Cardiac arrest due to underlying cardiac condition: Secondary | ICD-10-CM | POA: Diagnosis not present

## 2024-01-11 DIAGNOSIS — F1410 Cocaine abuse, uncomplicated: Secondary | ICD-10-CM | POA: Diagnosis present

## 2024-01-11 DIAGNOSIS — Z66 Do not resuscitate: Secondary | ICD-10-CM | POA: Diagnosis present

## 2024-01-11 DIAGNOSIS — F329 Major depressive disorder, single episode, unspecified: Secondary | ICD-10-CM | POA: Diagnosis present

## 2024-01-11 LAB — CBC WITH DIFFERENTIAL/PLATELET
Band Neutrophils: 9 %
Basophils Absolute: 0 10*3/uL (ref 0.0–0.1)
Basophils Relative: 0 %
Eosinophils Absolute: 0 10*3/uL (ref 0.0–0.5)
Eosinophils Relative: 0 %
HCT: 36.5 % (ref 36.0–46.0)
Hemoglobin: 11.6 g/dL — ABNORMAL LOW (ref 12.0–15.0)
Lymphocytes Relative: 18 %
Lymphs Abs: 0.3 10*3/uL — ABNORMAL LOW (ref 0.7–4.0)
MCH: 28.2 pg (ref 26.0–34.0)
MCHC: 31.8 g/dL (ref 30.0–36.0)
MCV: 88.6 fL (ref 80.0–100.0)
Monocytes Absolute: 0 10*3/uL — ABNORMAL LOW (ref 0.1–1.0)
Monocytes Relative: 2 %
Neutro Abs: 0.8 10*3/uL — ABNORMAL LOW (ref 1.7–7.7)
Neutrophils Relative %: 44 %
Other: 0.4 10*3/uL — ABNORMAL HIGH (ref 0.00–0.07)
Platelets: 170 10*3/uL (ref 150–400)
RBC Morphology: 10 %
RBC: 4.12 MIL/uL (ref 3.87–5.11)
RDW: 13.2 % (ref 11.5–15.5)
Smear Review: 17 %
WBC: 1.5 10*3/uL — ABNORMAL LOW (ref 4.0–10.5)
nRBC: 0 % (ref 0.0–0.2)

## 2024-01-11 LAB — URINALYSIS, W/ REFLEX TO CULTURE (INFECTION SUSPECTED)
Bilirubin Urine: NEGATIVE
Glucose, UA: NEGATIVE mg/dL
Hgb urine dipstick: NEGATIVE
Ketones, ur: NEGATIVE mg/dL
Leukocytes,Ua: NEGATIVE
Nitrite: NEGATIVE
Protein, ur: 100 mg/dL — AB
Specific Gravity, Urine: 1.018 (ref 1.005–1.030)
pH: 5 (ref 5.0–8.0)

## 2024-01-11 LAB — RESP PANEL BY RT-PCR (RSV, FLU A&B, COVID)  RVPGX2
Influenza A by PCR: POSITIVE — AB
Influenza B by PCR: NEGATIVE
Resp Syncytial Virus by PCR: NEGATIVE
SARS Coronavirus 2 by RT PCR: NEGATIVE

## 2024-01-11 LAB — BLOOD GAS, ARTERIAL
Acid-base deficit: 7.7 mmol/L — ABNORMAL HIGH (ref 0.0–2.0)
Bicarbonate: 20.7 mmol/L (ref 20.0–28.0)
FIO2: 100 %
MECHVT: 420 mL
O2 Saturation: 100 %
PEEP: 5 cmH2O
Patient temperature: 37
RATE: 20 {breaths}/min
pCO2 arterial: 53 mm[Hg] — ABNORMAL HIGH (ref 32–48)
pH, Arterial: 7.2 — ABNORMAL LOW (ref 7.35–7.45)
pO2, Arterial: 303 mm[Hg] — ABNORMAL HIGH (ref 83–108)

## 2024-01-11 LAB — COMPREHENSIVE METABOLIC PANEL
ALT: 17 U/L (ref 0–44)
AST: 63 U/L — ABNORMAL HIGH (ref 15–41)
Albumin: 3 g/dL — ABNORMAL LOW (ref 3.5–5.0)
Alkaline Phosphatase: 56 U/L (ref 38–126)
Anion gap: 20 — ABNORMAL HIGH (ref 5–15)
BUN: 52 mg/dL — ABNORMAL HIGH (ref 8–23)
CO2: 17 mmol/L — ABNORMAL LOW (ref 22–32)
Calcium: 7 mg/dL — ABNORMAL LOW (ref 8.9–10.3)
Chloride: 98 mmol/L (ref 98–111)
Creatinine, Ser: 3.36 mg/dL — ABNORMAL HIGH (ref 0.44–1.00)
GFR, Estimated: 14 mL/min — ABNORMAL LOW (ref >60)
Glucose, Bld: 182 mg/dL — ABNORMAL HIGH (ref 70–99)
Potassium: 3.1 mmol/L — ABNORMAL LOW (ref 3.5–5.1)
Sodium: 135 mmol/L (ref 135–145)
Total Bilirubin: 0.6 mg/dL (ref 0.0–1.2)
Total Protein: 5.9 g/dL — ABNORMAL LOW (ref 6.5–8.1)

## 2024-01-11 LAB — RAPID URINE DRUG SCREEN, HOSP PERFORMED
Amphetamines: POSITIVE — AB
Barbiturates: NOT DETECTED
Benzodiazepines: NOT DETECTED
Cocaine: POSITIVE — AB
Opiates: NOT DETECTED
Tetrahydrocannabinol: POSITIVE — AB

## 2024-01-11 LAB — LIPASE, BLOOD: Lipase: 37 U/L (ref 11–51)

## 2024-01-11 LAB — BRAIN NATRIURETIC PEPTIDE: B Natriuretic Peptide: 216.6 pg/mL — ABNORMAL HIGH (ref 0.0–100.0)

## 2024-01-11 LAB — APTT: aPTT: 31 s (ref 24–36)

## 2024-01-11 LAB — I-STAT CG4 LACTIC ACID, ED
Lactic Acid, Venous: 2.5 mmol/L (ref 0.5–1.9)
Lactic Acid, Venous: 3.6 mmol/L (ref 0.5–1.9)

## 2024-01-11 LAB — PROTIME-INR
INR: 1.1 (ref 0.8–1.2)
Prothrombin Time: 14.7 s (ref 11.4–15.2)

## 2024-01-11 LAB — TROPONIN I (HIGH SENSITIVITY)
Troponin I (High Sensitivity): 26 ng/L — ABNORMAL HIGH (ref <18)
Troponin I (High Sensitivity): 31 ng/L — ABNORMAL HIGH (ref <18)

## 2024-01-11 LAB — HIV ANTIBODY (ROUTINE TESTING W REFLEX): HIV Screen 4th Generation wRfx: NONREACTIVE

## 2024-01-11 MED ORDER — FAMOTIDINE 40 MG/5ML PO SUSR
20.0000 mg | Freq: Every day | ORAL | Status: DC
  Filled 2024-01-11: qty 2.5

## 2024-01-11 MED ORDER — HEPARIN SODIUM (PORCINE) 5000 UNIT/ML IJ SOLN: 5000.0000 [IU] | Freq: Three times a day (TID) | INTRAMUSCULAR | Status: DC

## 2024-01-11 MED ORDER — OSELTAMIVIR PHOSPHATE 75 MG PO CAPS: 75.0000 mg | ORAL_CAPSULE | Freq: Two times a day (BID) | ORAL | Status: DC

## 2024-01-11 MED ORDER — DOCUSATE SODIUM 50 MG/5ML PO LIQD: 100.0000 mg | Freq: Two times a day (BID) | ORAL | Status: DC | PRN

## 2024-01-11 MED ORDER — CEFTRIAXONE SODIUM 1 G IJ SOLR: 1.0000 g | INTRAMUSCULAR | Status: DC

## 2024-01-11 MED ORDER — LACTATED RINGERS IV BOLUS
500.0000 mL | Freq: Once | INTRAVENOUS | Status: AC
  Administered 2024-01-11: 500 mL via INTRAVENOUS

## 2024-01-11 MED ORDER — PROSOURCE TF20 ENFIT COMPATIBL EN LIQD
60.0000 mL | Freq: Every day | ENTERAL | Status: DC
  Filled 2024-01-11: qty 60

## 2024-01-11 MED ORDER — FAMOTIDINE 20 MG PO TABS: 20.0000 mg | ORAL_TABLET | Freq: Two times a day (BID) | ORAL | Status: DC

## 2024-01-11 MED ORDER — SODIUM CHLORIDE 0.9 % IV SOLN
100.0000 mg | Freq: Two times a day (BID) | INTRAVENOUS | Status: DC
  Administered 2024-01-11: 100 mg via INTRAVENOUS
  Filled 2024-01-11: qty 100

## 2024-01-11 MED ORDER — ACETAMINOPHEN 325 MG PO TABS
650.0000 mg | ORAL_TABLET | Freq: Four times a day (QID) | ORAL | Status: DC | PRN
  Administered 2024-01-11: 650 mg
  Filled 2024-01-11: qty 2

## 2024-01-11 MED ORDER — PIVOT 1.5 CAL PO LIQD
1000.0000 mL | ORAL | Status: DC
Start: 2024-01-11 — End: 2024-01-11
  Filled 2024-01-11: qty 1000

## 2024-01-11 MED ORDER — LACTATED RINGERS IV BOLUS
1000.0000 mL | Freq: Once | INTRAVENOUS | Status: AC
Start: 2024-01-11 — End: 2024-01-11
  Administered 2024-01-11: 1000 mL via INTRAVENOUS

## 2024-01-11 MED ORDER — CHLORHEXIDINE GLUCONATE CLOTH 2 % EX PADS: 6.0000 | MEDICATED_PAD | Freq: Every day | CUTANEOUS | Status: DC

## 2024-01-11 MED ORDER — FAMOTIDINE IN NACL 20-0.9 MG/50ML-% IV SOLN: 20.0000 mg | Freq: Two times a day (BID) | INTRAVENOUS | Status: DC

## 2024-01-11 MED ORDER — ORAL CARE MOUTH RINSE: 15.0000 mL | OROMUCOSAL | Status: DC | PRN

## 2024-01-11 MED ORDER — PROPOFOL 1000 MG/100ML IV EMUL
5.0000 ug/kg/min | INTRAVENOUS | Status: DC
  Administered 2024-01-11: 5 ug/kg/min via INTRAVENOUS

## 2024-01-11 MED ORDER — OSELTAMIVIR PHOSPHATE 6 MG/ML PO SUSR
30.0000 mg | Freq: Every day | ORAL | Status: DC
  Filled 2024-01-11: qty 12.5

## 2024-01-11 MED ORDER — LACTATED RINGERS IV SOLN: INTRAVENOUS | Status: DC

## 2024-01-11 MED ORDER — ORAL CARE MOUTH RINSE: 15.0000 mL | OROMUCOSAL | Status: DC

## 2024-01-11 MED ORDER — VANCOMYCIN HCL IN DEXTROSE 1-5 GM/200ML-% IV SOLN
1000.0000 mg | Freq: Once | INTRAVENOUS | Status: AC
  Administered 2024-01-11: 1000 mg via INTRAVENOUS
  Filled 2024-01-11: qty 200

## 2024-01-11 MED ORDER — DOCUSATE SODIUM 100 MG PO CAPS: 100.0000 mg | ORAL_CAPSULE | Freq: Two times a day (BID) | ORAL | Status: DC | PRN

## 2024-01-11 MED ORDER — POLYETHYLENE GLYCOL 3350 17 G PO PACK: 17.0000 g | PACK | Freq: Every day | ORAL | Status: DC | PRN

## 2024-01-11 MED ORDER — SODIUM CHLORIDE 0.9 % IV SOLN
2.0000 g | Freq: Once | INTRAVENOUS | Status: AC
  Administered 2024-01-11 (×2): 2 g via INTRAVENOUS
  Filled 2024-01-11: qty 12.5

## 2024-01-11 MED FILL — Medication: Qty: 1 | Status: AC

## 2024-01-16 LAB — CULTURE, BLOOD (ROUTINE X 2)
Culture: NO GROWTH
Culture: NO GROWTH
Special Requests: ADEQUATE

## 2024-02-07 NOTE — Progress Notes (Signed)
Attempted to reach patient's family  Called brothers number Roanna Epley holland-left a message for him to call the hospital for an update-called [phone]  Arvin Collard Cookson-patient's son on [phone] says line has calling restrictions

## 2024-02-07 NOTE — Progress Notes (Signed)
PHARMACY NOTE:  ANTIMICROBIAL RENAL DOSAGE ADJUSTMENT  Current antimicrobial regimen includes a mismatch between antimicrobial dosage and estimated renal function.  As per policy approved by the Pharmacy & Therapeutics and Medical Executive Committees, the antimicrobial dosage will be adjusted accordingly.  Current antimicrobial dosage:  Tamiflu 75mg  per tube BID x 5days  Indication: +Influenza A  Renal Function: Scr 3.36, wt 39.5kg > est CrCl ~10-15 ml/min  CrCl cannot be calculated (Unknown ideal weight.). []      On intermittent HD, scheduled: []      On CRRT    Antimicrobial dosage has been changed to:   Tamiflu 30mg  per tube daily x 5 days  Additional comments:   Thank you for allowing pharmacy to be a part of this patient's care.  Junita Push, [facility] 01/19/2024 3:18 AM

## 2024-02-07 NOTE — Progress Notes (Signed)
Pt arrived to unit at approximately at 0600. Patient was connected ICU monitor. Shortly after vitals were taken asystole present on monitor. Femoral pulse absent, code blue activated at 0605. Pt pronounce at [phone] by ED provider. E-link attempting to reach out to next of kin, no answer.

## 2024-02-07 NOTE — Progress Notes (Signed)
Brief Nutrition Note  Consult received for enteral/tube feeding initiation and management.  Adult Enteral Nutrition Protocol initiated. Full assessment to follow.  Og tube in place with tip located in stomach per xray imaging.   Admitting Dx: Respiratory failure (HCC) [J96.90] Influenza A [J10.1] Acute respiratory failure with hypoxia (HCC) [J96.01] Multifocal pneumonia [J18.9]  Body mass index is 15.41 kg/m.  Labs:  Recent Labs  Lab 01/10/24 2331  NA 135  K 3.1*  CL 98  CO2 17*  BUN 52*  CREATININE 3.36*  CALCIUM 7.0*  GLUCOSE 182*  Pivot 1.5 at 49ml/hr.Prosource 60ml daily  Jamelle Haring RDN, LDN Clinical Dietitian   If unable to reach, please contact "RD Inpatient" secure chat group between 8 am-4 pm daily"

## 2024-02-07 NOTE — H&P (Signed)
NAMEGracey Burgess, MRN:  [id number], DOB:  Aug 05, 1951, LOS: 0 ADMISSION DATE:  01/10/2024, CONSULTATION DATE: 01/28/2024 REFERRING MD: Dr. Oletta Cohn ED, CHIEF COMPLAINT: Respiratory failure  History of Present Illness:  Presented to the emergency department with complaints of difficulty breathing, right-sided chest pain She had had a cough about 2 weeks with increasing shortness of breath Abdominal pain for few days with diarrhea  EMS was activated by patient's brother  Respiratory status worsened in the emergency department leading to endotracheal intubation  Pertinent  Medical History   Past Medical History:  Diagnosis Date   CAD (coronary artery disease)    Hyperlipidemia    Hypertension    Polysubstance abuse (HCC)    cocaine, heroine, methamphetamine   Tobacco abuse      Significant Hospital Events: Including procedures, antibiotic start and stop dates in addition to other pertinent events   Chest x-ray 01/14/2024-multifocal infiltrate  Interim History / Subjective:  Elderly lady, frail  Objective   Blood pressure 93/65, pulse 92, temperature 99.6 F (37.6 C), resp. rate 18, height 5\' 3"  (1.6 m), SpO2 96%.    Vent Mode: PRVC FiO2 (%):  [50 %-100 %] 50 % Set Rate:  [20 bmp] 20 bmp Vt Set:  [420 mL] 420 mL PEEP:  [5 cmH20] 5 cmH20 Plateau Pressure:  [17 cmH20] 17 cmH20  No intake or output data in the 24 hours ending 01/24/2024 0255 There were no vitals filed for this visit.  Examination: General: Elderly, frail, HENT: Moist oral mucosa, endotracheal tube in place Lungs: Few rales at the bases with some rhonchi Cardiovascular: S1-S2 appreciated Abdomen: Soft, bowel sounds appreciated Extremities: No clubbing, no edema Neuro: Sedated GU:   I reviewed nursing notes,  last 24 h vitals and pain scores, last 48 h intake and output, last 24 h labs and trends, and last 24 h imaging results. Resolved Hospital Problem list     Assessment & Plan:   Acute hypoxemic  respiratory failure -Continue mechanical ventilation per ARDS protocol -Target TVol 6-8cc/kgIBW -Target Plateau Pressure < 30cm H20 -Target driving pressure less than 15 cm of water -Target PaO2 55-65: titrate PEEP/FiO2 per protocol -Ventilator associated pneumonia prevention protocol -Bronchodilators as needed  Substance use disorder with toxicology positive for cocaine, THC, amphetamines -Continue close monitoring  Influenza A positive -Oseltamivir per tube for 5 days  Multilobar pneumonia -Doxycycline and Rocephin -Did receive 1 dose of cefepime and vancomycin  Leukopenia likely related to viral infection and pneumonia -Monitor closely  Acute kidney injury -Maintain renal perfusion -Avoid nephrotoxic agents -Close monitoring of I's and O's  Hypothermia -Bair hugger placed  Nutrition -Place NG tube -Will start tube feeds  History of hypertension -Continue to monitor  History of tobacco use History of polysubstance use  History of atrial fibrillation -Not on anticoagulation as per last hospitalization  History of coronary artery disease with stenting in 2012 -Was not compliant with medications that included Lipitor, aspirin  History of major depression  CODE STATUS needs clarified Appears to have been DO NOT RESUSCITATE status during previous hospitalization   Best Practice (right click and "Reselect all SmartList Selections" daily)   Diet/type: tubefeeds DVT prophylaxis prophylactic heparin  Pressure ulcer(s): N/A GI prophylaxis: H2B Lines: N/A Foley:  Yes, and it is still needed Code Status:  full code Last date of multidisciplinary goals of care discussion [pending]  Labs   CBC: Recent Labs  Lab 01/10/24 2331  WBC 1.5*  NEUTROABS 0.8*  HGB 11.6*  HCT 36.5  MCV 88.6  PLT 170    Basic Metabolic Panel: Recent Labs  Lab 01/10/24 2331  NA 135  K 3.1*  CL 98  CO2 17*  GLUCOSE 182*  BUN 52*  CREATININE 3.36*  CALCIUM 7.0*    GFR: CrCl cannot be calculated (Unknown ideal weight.). Recent Labs  Lab 01/10/24 2331 01/12/2024 0105 01/14/2024 0223  WBC 1.5*  --   --   LATICACIDVEN  --  2.5* 3.6*    Liver Function Tests: Recent Labs  Lab 01/10/24 2331  AST 63*  ALT 17  ALKPHOS 56  BILITOT 0.6  PROT 5.9*  ALBUMIN 3.0*   Recent Labs  Lab 01/10/24 2331  LIPASE 37   No results for input(s): "AMMONIA" in the last 168 hours.  ABG    Component Value Date/Time   PHART 7.2 (L) 01/31/2024 0031   PCO2ART 53 (H) 01/21/2024 0031   PO2ART 303 (H) 01/15/2024 0031   HCO3 20.7 01/15/2024 0031   ACIDBASEDEF 7.7 (H) 01/14/2024 0031   O2SAT 100 01/17/2024 0031     Coagulation Profile: Recent Labs  Lab 01/10/24 2355  INR 1.1    Cardiac Enzymes: No results for input(s): "CKTOTAL", "CKMB", "CKMBINDEX", "TROPONINI" in the last 168 hours.  HbA1C: Hgb A1c MFr Bld  Date/Time Value Ref Range Status  04/27/2022 10:29 PM 5.7 (H) 4.8 - 5.6 % Final    Comment:    (NOTE) Pre diabetes:          5.7%-6.4%  Diabetes:              >6.4%  Glycemic control for   <7.0% adults with diabetes     CBG: No results for input(s): "GLUCAP" in the last 168 hours.  Review of Systems:   Unable to provide history  Past Medical History:  She,  has a past medical history of CAD (coronary artery disease), Hyperlipidemia, Hypertension, Polysubstance abuse (HCC), and Tobacco abuse.   Surgical History:   Past Surgical History:  Procedure Laterality Date   INTRAMEDULLARY (IM) NAIL INTERTROCHANTERIC Left 10/21/2023   Procedure: INTRAMEDULLARY (IM) NAIL INTERTROCHANTERIC;  Surgeon: Luci Bank, MD;  Location: [facility] OR;  Service: Orthopedics;  Laterality: Left;     Social History:   reports that she has been smoking cigarettes. She does not have any smokeless tobacco history on file. She reports that she does not currently use alcohol. She reports current drug use. Drug: "Crack" cocaine.   Family History:  Her family  history includes CAD in her father; Heart attack in her father; Stroke in her brother.   Allergies No Known Allergies   Home Medications  Prior to Admission medications   Medication Sig Start Date End Date Taking? Authorizing Provider  aspirin EC 81 MG tablet Take 1 tablet (81 mg total) by mouth 2 (two) times daily after a meal for 2 weeks then once a day for 2 weeks for DVT prevention. 10/24/23   Elodia Florence, PA-C  HYDROcodone-acetaminophen (NORCO/VICODIN) 5-325 MG tablet Take 1 tablet by mouth every 6 (six) hours as needed for moderate pain (pain score 4-6) or severe pain (pain score 7-10) (post op pain). 10/24/23   Elodia Florence, PA-C  polyethylene glycol (MIRALAX / GLYCOLAX) 17 g packet Take 17 g by mouth daily as needed for mild constipation. Patient not taking: Reported on 10/20/2023 05/04/22   Marolyn Haller, MD    The patient is critically ill with multiple organ systems failure and requires high complexity decision making for assessment and support, frequent  evaluation and titration of therapies, application of advanced monitoring technologies and extensive interpretation of multiple databases. Critical Care Time devoted to patient care services described in this note independent of APP/resident time (if applicable)  is 40 minutes.   Virl Diamond MD [facility] Personal pager: See Amion If unanswered, please page CCM On-call: #[phone]

## 2024-02-07 NOTE — ED Notes (Signed)
O2 sat noted to be 85-86%, RT called, per RT vent was decreased to 50% due to ABG result pO2 of 303, will come to assess

## 2024-02-07 NOTE — Death Summary Note (Signed)
DEATH SUMMARY   Patient Details  Name: Shannon Burgess MRN: [id number] DOB: 08-14-1951  Admission/Discharge Information   Admit Date:  17-Jan-2024  Date of Death:  18-Jan-2024  Time of Death:  0620  Length of Stay: 0  Referring Physician: Pcp, No   Reason(s) for Hospitalization  Patient presented to the hospital with shortness of breath of 2 weeks duration Brother activated EMS with concerns regarding possible pneumonia  Diagnoses  Preliminary cause of death:  Sepsis secondary to multilobar pneumonia and influenza A infection Secondary Diagnoses (including complications and co-morbidities):  Principal Problem:   Respiratory failure (HCC) Substance use disorder Acute hypoxemic respiratory failure Acute kidney injury Coronary artery disease History of major depression Multilobar pneumonia  Brief Hospital Course (including significant findings, care, treatment, and services provided and events leading to death)  Shannon Burgess is a 73 y.o. year old female who brought into the hospital with complaints of shortness of breath, abdominal pain for few days with diarrhea  EMS activated by patient's brother  Respiratory status worsened while in the emergency department leading to respiratory failure, patient was intubated and placed on the vent Started on antibiotics for multilobar pneumonia, x-ray significant for multifocal infiltrates, fluid resuscitation Labs significant for acute kidney injury, urinary toxicology positive for cocaine, THC, amphetamines, influenza A+  Despite ongoing care patient decompensated and went into cardiorespiratory arrest  Succumbed to her illness on 01-18-24, 0620 hrs    Pertinent Labs and Studies  Significant Diagnostic Studies DG Chest Port 1 View Result Date: Jan 18, 2024 CLINICAL DATA:  Intubation EXAM: PORTABLE CHEST 1 VIEW COMPARISON:  Jan 17, 2024 FINDINGS: Endotracheal tube present with tip measuring 4.2 cm above the carina. Enteric tube is present. Tip  projects over the midline in the abdominopelvic junction likely in the distal stomach. Heart size is normal. Patchy perihilar and basilar infiltrates, greater on the left. Similar appearance to previous study. This is likely multifocal pneumonia or possibly edema. No pleural effusions. No pneumothorax. Mediastinal contours appear intact. Calcification of the aorta. Surgical clips in the right upper quadrant. IMPRESSION: 1. Appliances appear in satisfactory position. 2. Patchy infiltrates in the lungs likely representing multifocal pneumonia or possibly edema. Electronically Signed   By: Burman Nieves M.D.   On: 01-18-24 01:38   DG Chest Port 1 View Result Date: 2024-01-17 CLINICAL DATA:  Cough for 2 weeks with respiratory distress. EXAM: PORTABLE CHEST 1 VIEW COMPARISON:  10/20/2023 FINDINGS: Stable cardiomediastinal silhouette. Aortic atherosclerotic calcification. Patchy bilateral airspace opacities suggesting multifocal pneumonia. No pleural effusion or pneumothorax. IMPRESSION: Patchy bilateral airspace opacities suggesting multifocal pneumonia. Electronically Signed   By: Minerva Fester M.D.   On: 2024-01-17 23:55    Microbiology Recent Results (from the past 240 hours)  Resp panel by RT-PCR (RSV, Flu A&B, Covid) Anterior Nasal Swab     Status: Abnormal   Collection Time: 01-17-2024 11:37 PM   Specimen: Anterior Nasal Swab  Result Value Ref Range Status   SARS Coronavirus 2 by RT PCR NEGATIVE NEGATIVE Final    Comment: (NOTE) SARS-CoV-2 target nucleic acids are NOT DETECTED.  The SARS-CoV-2 RNA is generally detectable in upper respiratory specimens during the acute phase of infection. The lowest concentration of SARS-CoV-2 viral copies this assay can detect is 138 copies/mL. A negative result does not preclude SARS-Cov-2 infection and should not be used as the sole basis for treatment or other patient management decisions. A negative result may occur with  improper specimen  collection/handling, submission of specimen other than nasopharyngeal swab, presence of viral  mutation(s) within the areas targeted by this assay, and inadequate number of viral copies(<138 copies/mL). A negative result must be combined with clinical observations, patient history, and epidemiological information. The expected result is Negative.  Fact Sheet for Patients:  BloggerCourse.com  Fact Sheet for Healthcare Providers:  SeriousBroker.it  This test is no t yet approved or cleared by the [place] FDA and  has been authorized for detection and/or diagnosis of SARS-CoV-2 by FDA under an Emergency Use Authorization (EUA). This EUA will remain  in effect (meaning this test can be used) for the duration of the COVID-19 declaration under Section 564(b)(1) of the Act, 21 U.S.C.section 360bbb-3(b)(1), unless the authorization is terminated  or revoked sooner.       Influenza A by PCR POSITIVE (A) NEGATIVE Final   Influenza B by PCR NEGATIVE NEGATIVE Final    Comment: (NOTE) The Xpert Xpress SARS-CoV-2/FLU/RSV plus assay is intended as an aid in the diagnosis of influenza from Nasopharyngeal swab specimens and should not be used as a sole basis for treatment. Nasal washings and aspirates are unacceptable for Xpert Xpress SARS-CoV-2/FLU/RSV testing.  Fact Sheet for Patients: BloggerCourse.com  Fact Sheet for Healthcare Providers: SeriousBroker.it  This test is not yet approved or cleared by the [place] FDA and has been authorized for detection and/or diagnosis of SARS-CoV-2 by FDA under an Emergency Use Authorization (EUA). This EUA will remain in effect (meaning this test can be used) for the duration of the COVID-19 declaration under Section 564(b)(1) of the Act, 21 U.S.C. section 360bbb-3(b)(1), unless the authorization is terminated or revoked.     Resp Syncytial  Virus by PCR NEGATIVE NEGATIVE Final    Comment: (NOTE) Fact Sheet for Patients: BloggerCourse.com  Fact Sheet for Healthcare Providers: SeriousBroker.it  This test is not yet approved or cleared by the [place] FDA and has been authorized for detection and/or diagnosis of SARS-CoV-2 by FDA under an Emergency Use Authorization (EUA). This EUA will remain in effect (meaning this test can be used) for the duration of the COVID-19 declaration under Section 564(b)(1) of the Act, 21 U.S.C. section 360bbb-3(b)(1), unless the authorization is terminated or revoked.  Performed at [facility], [address] [place], [place] [address]   Blood Culture (routine x 2)     Status: None (Preliminary result)   Collection Time: 01/16/2024  1:00 AM   Specimen: BLOOD RIGHT ARM  Result Value Ref Range Status   Specimen Description   Final    BLOOD RIGHT ARM Performed at [facility], [address] [place], [place] [address]    Special Requests   Final    BOTTLES DRAWN AEROBIC AND ANAEROBIC Blood Culture results may not be optimal due to an inadequate volume of blood received in culture bottles Performed at [facility], [address] [place], [place] [address]    Culture PENDING  Incomplete   Report Status PENDING  Incomplete  Blood Culture (routine x 2)     Status: None (Preliminary result)   Collection Time: 02/05/2024  1:19 AM   Specimen: BLOOD RIGHT ARM  Result Value Ref Range Status   Specimen Description   Final    BLOOD RIGHT ARM Performed at [facility], [address], [place], [place] [address]    Special Requests   Final    BOTTLES DRAWN AEROBIC ONLY Blood Culture adequate volume Performed at [facility], [address] [place], [place] [address]  Culture PENDING  Incomplete   Report Status PENDING  Incomplete    Lab Basic Metabolic Panel: Recent  Labs  Lab 01/10/24 2331  NA 135  K 3.1*  CL 98  CO2 17*  GLUCOSE 182*  BUN 52*  CREATININE 3.36*  CALCIUM 7.0*   Liver Function Tests: Recent Labs  Lab 01/10/24 2331  AST 63*  ALT 17  ALKPHOS 56  BILITOT 0.6  PROT 5.9*  ALBUMIN 3.0*   Recent Labs  Lab 01/10/24 2331  LIPASE 37   No results for input(s): "AMMONIA" in the last 168 hours. CBC: Recent Labs  Lab 01/10/24 2331  WBC 1.5*  NEUTROABS 0.8*  HGB 11.6*  HCT 36.5  MCV 88.6  PLT 170   Cardiac Enzymes: No results for input(s): "CKTOTAL", "CKMB", "CKMBINDEX", "TROPONINI" in the last 168 hours. Sepsis Labs: Recent Labs  Lab 01/10/24 2331 01/26/2024 0105 02/02/2024 0223  WBC 1.5*  --   --   LATICACIDVEN  --  2.5* 3.6*    Procedures/Operations     Emmi Wertheim A Dwyane Dupree 01/20/2024, 8:11 AM

## 2024-02-07 NOTE — Progress Notes (Signed)
RT tried ABG but without any success. Pt is hard stick. Previously Dr. Blinda Leatherwood had to do femoral stick to obtain ABG.

## 2024-02-07 NOTE — ED Notes (Signed)
Call placed to [facility] regarding pt status, orders rcvd in process

## 2024-02-07 NOTE — Progress Notes (Signed)
eLink Physician-Brief Progress Note Patient Name: Elton Catalano DOB: [birth year] MRN: [id number]   Date of Service  01/13/2024  HPI/Events of Note  73 year old female that initially presented to the emergency department complaints of difficulty breathing and right-sided chest pain.  Patient's brother Baldo Ash and [place] activated EMS alert and the patient was brought into the emergency department.  She developed fevers, tachycardia, tachypnea and eventually required intubation.  Patient unfortunately developed a cardiac arrest and the ER team responded at bedside.  Despite exhaustive efforts, the patient expired.  Patient's emergency contact is listed as son Carolena Fairbank but his numbers are not connected.  Attempted the patient's own numbers.  [address] [place], [place] [address] [phone] [facility]) [phone] [facility]) Brother, Emergency Contact  Zyriah Mask [place], [place] [address] [phone] [facility]) Son, Emergency Contact    eICU Interventions  In chart review, Peggyann Juba number listed above is connected.  Left a message with the brother to call back.  Primary team notified.     Intervention Category Major Interventions: Code management / supervision  Haron Beilke 01/28/2024, 6:28 AM

## 2024-02-07 NOTE — Progress Notes (Signed)
eLink Physician-Brief Progress Note Patient Name: Shannon Burgess DOB: [birth year] MRN: [id number]   Date of Service  01/16/2024  HPI/Events of Note  Presented to the emergency department with complaints of difficulty breathing, right-sided chest pain intubated.  Blood pressure is downtrending with maps bordering 65.  Temp is elevated to 101.3.  FiO2 uptrending, PEEP of 5.  Influenza A positive complicated by respiratory failure  eICU Interventions  Increase PEEP to 8.  Tylenol via G-tube.  Low volume fluids for now.     Intervention Category Intermediate Interventions: Respiratory distress - evaluation and management  Shannon Burgess 02/01/2024, 4:21 AM

## 2024-02-07 NOTE — ED Notes (Signed)
 RT at bedside.

## 2024-02-07 NOTE — Progress Notes (Addendum)
Received call back from brother-Garland [place]. He was informed of the death of his sister and was unsure of arrangement plans as he has not seen her in 4 years as he has had strokes and lives in [place]. He states she does have children (Son-Travis) and daughter Irving Burton), but Irving Burton is not listed in patient emergency contact list. He did provide her number as [phone] and stated he called, left voicemail and texted her. Attempt to provide patient placement number but he was unable to receive it/type it into his phone. Patient placement is aware of this situation and will attempt to reach out.   Travis's (Son) numbers are not available when attempting to call.   Patient taken to morgue and belongs sent with her. Belongings included wallet, purse, debit card, ID, jacket, shoes, ankle bracelet (left on patient) and necklace with cross (left on patient). Belonging bag labeled with multiple patient stickers.

## 2024-02-07 NOTE — ED Provider Notes (Signed)
Department of Emergency Medicine CODE BLUE CONSULTATION    Code Blue CONSULT NOTE  Chief Complaint: Cardiac arrest/unresponsive   Level V Caveat: Unresponsive  History of present illness: I was contacted by the hospital for a CODE BLUE cardiac arrest upstairs and presented to the patient's bedside.   Noted by nurse to go into asystole on the monitor, no pulses palpable, CPR initiated. ROS: Unable to obtain, Level V caveat  Scheduled Meds:  Chlorhexidine Gluconate Cloth  6 each Topical Daily   famotidine  20 mg Per Tube Daily   feeding supplement (PIVOT 1.5 CAL)  1,000 mL Per Tube Q24H   feeding supplement (PROSource TF20)  60 mL Per Tube Daily   heparin  5,000 Units Subcutaneous Q8H   mouth rinse  15 mL Mouth Rinse Q2H   oseltamivir  30 mg Per Tube Daily   Continuous Infusions:  cefTRIAXone (ROCEPHIN)  IV     doxycycline (VIBRAMYCIN) IV 100 mg (02/05/2024 0520)   lactated ringers 100 mL/hr at 02/03/2024 0425   propofol (DIPRIVAN) infusion 20 mcg/kg/min (01/24/2024 0228)   PRN Meds:.acetaminophen, docusate, mouth rinse, polyethylene glycol Past Medical History:  Diagnosis Date   CAD (coronary artery disease)    Hyperlipidemia    Hypertension    Polysubstance abuse (HCC)    cocaine, heroine, methamphetamine   Tobacco abuse    Past Surgical History:  Procedure Laterality Date   INTRAMEDULLARY (IM) NAIL INTERTROCHANTERIC Left 10/21/2023   Procedure: INTRAMEDULLARY (IM) NAIL INTERTROCHANTERIC;  Surgeon: Luci Bank, MD;  Location: [facility] OR;  Service: Orthopedics;  Laterality: Left;   Social History   Socioeconomic History   Marital status: Divorced    Spouse name: Not on file   Number of children: Not on file   Years of education: Not on file   Highest education level: Not on file  Occupational History   Not on file  Tobacco Use   Smoking status: Every Day    Current packs/day: 1.00    Types: Cigarettes   Smokeless tobacco: Not on file  Vaping Use   Vaping  status: Never Used  Substance and Sexual Activity   Alcohol use: Not Currently   Drug use: Yes    Types: "Crack" cocaine   Sexual activity: Not on file  Other Topics Concern   Not on file  Social History Narrative   Not on file   Social Drivers of Health   Financial Resource Strain: Not on file  Food Insecurity: Food Insecurity Present (10/21/2023)   Hunger Vital Sign    Worried About Running Out of Food in the Last Year: Often true    Ran Out of Food in the Last Year: Often true  Transportation Needs: Unmet Transportation Needs (10/21/2023)   PRAPARE - Administrator, Civil Service (Medical): Yes    Lack of Transportation (Non-Medical): Yes  Physical Activity: Not on file  Stress: Not on file  Social Connections: Not on file  Intimate Partner Violence: Not At Risk (10/21/2023)   Humiliation, Afraid, Rape, and Kick questionnaire    Fear of Current or Ex-Partner: No    Emotionally Abused: No    Physically Abused: No    Sexually Abused: No   No Known Allergies  Last set of Vital Signs (not current) Vitals:   01/13/2024 0603 01/22/2024 0605  BP: 109/71   Pulse:  91  Resp:    Temp:    SpO2:  100%      Physical Exam  Gen:  unresponsive Cardiovascular: pulseless  Resp: apneic. Breath sounds equal bilaterally with bagging  Abd: nondistended  Neuro: GCS 3, unresponsive to pain  HEENT: No blood in posterior pharynx, gag reflex absent  Neck: No crepitus  Musculoskeletal: No deformity  Skin: warm  Procedures  CRITICAL CARE Performed by: Gilda Crease Total critical care time: 30 Critical care time was exclusive of separately billable procedures and treating other patients. Critical care was necessary to treat or prevent imminent or life-threatening deterioration. Critical care was time spent personally by me on the following activities: development of treatment plan with patient and/or surrogate as well as nursing, discussions with consultants,  evaluation of patient's response to treatment, examination of patient, obtaining history from patient or surrogate, ordering and performing treatments and interventions, ordering and review of laboratory studies, ordering and review of radiographic studies, pulse oximetry and re-evaluation of patient's condition.  Cardiopulmonary Resuscitation (CPR) Procedure Note  Directed/Performed by: Gilda Crease I personally directed ancillary staff and/or performed CPR in an effort to regain return of spontaneous circulation and to maintain cardiac, neuro and systemic perfusion.    Medical Decision making  Encountered patient in the ICU.  PR had already been initiated and she had received 2 rounds of epi.  CPR was continued, 2 additional rounds of epi were administered.  Patient had been in acute renal failure and also had increasing lactic acid values prior to admission, given 2 rounds of epi.  Appropriate pulse checks were performed.  Asystole and PEA through the majority of code.  She did briefly have an episode of V-fib that was shocked once at 120 J biphasic, went back into PEA.  Patient had no response to ACLS interventions, declared dead at 6:20 AM.  Assessment and Plan  expired    Gilda Crease, MD 02/03/2024 [phone]

## 2024-02-07 NOTE — ED Notes (Signed)
 Bair hugger removed

## 2024-02-07 NOTE — Progress Notes (Addendum)
Attempted twice to call brother and son. Son number does not have option to leave voicemail. Voicemail left on [employer] (Brother) number as both home and cell number are the same. Also attempted calling patient phone number incase family had possession. No response. House coverage notified.

## 2024-02-07 NOTE — ED Notes (Signed)
ED TO INPATIENT HANDOFF REPORT  ED Nurse Name and Phone #:   S Name/Age/Gender Shannon Burgess 73 y.o. female Room/Bed: RESA/RESA  Code Status   Code Status: Full Code  Home/SNF/Other Home  Is this baseline?   Triage Complete: Triage complete  Chief Complaint Respiratory failure [facility]) [J96.90]  Triage Note Pt's brother called EMS bc pt has had a cough for 2 weeks with increase respiratory distress and was worried about pneumonia. Pt has c/o of abdominal pain for several days with diarrhea. Pt states it moves up into her flank.   EMS 88 % O2 on room air 15L non rebreather 93-94% Cbg 74   Allergies No Known Allergies  Level of Care/Admitting Diagnosis ED Disposition     ED Disposition  Admit   Condition  --   Comment  Hospital Area: [facility] [[id number]]  Level of Care: ICU [6]  May admit patient to [facility] or [facility] if equivalent level of care is available:: Yes  Covid Evaluation: Confirmed COVID Negative  Diagnosis: Respiratory failure [facility]) [[id number]]  Admitting Physician: Tomma Lightning [[id number]]  Attending Physician: Tomma Lightning [phone]  Certification:: I certify this patient will need inpatient services for at least 2 midnights  Expected Medical Readiness: 01/15/2024          B Medical/Surgery History Past Medical History:  Diagnosis Date   CAD (coronary artery disease)    Hyperlipidemia    Hypertension    Polysubstance abuse (HCC)    cocaine, heroine, methamphetamine   Tobacco abuse    Past Surgical History:  Procedure Laterality Date   INTRAMEDULLARY (IM) NAIL INTERTROCHANTERIC Left 10/21/2023   Procedure: INTRAMEDULLARY (IM) NAIL INTERTROCHANTERIC;  Surgeon: Luci Bank, MD;  Location: [facility] OR;  Service: Orthopedics;  Laterality: Left;     A IV Location/Drains/Wounds Patient Lines/Drains/Airways Status     Active Line/Drains/Airways     Name Placement date Placement time Site Days   Peripheral IV  01/10/24 20 G Anterior;Right Forearm 01/10/24  2259  Forearm  1   Peripheral IV 01/10/24 20 G Left;Posterior Forearm 01/10/24  2300  Forearm  1   Peripheral IV 01/29/2024 18 G 1" Right Antecubital 01/22/2024  0106  Antecubital  less than 1   Peripheral IV 01/30/2024 22 G 1" Posterior;Right Hand 02/03/2024  0109  Hand  less than 1   NG/OG Vented/Dual Lumen 18 Fr. Oral Marking at nare/corner of mouth 58 cm 01/18/2024  0202  Oral  less than 1   Urethral Catheter Azia RN Temperature probe 14 Fr. 01/20/2024  0014  Temperature probe  less than 1   Airway 7.5 mm 01/17/2024  0001  -- less than 1            Intake/Output Last 24 hours No intake or output data in the 24 hours ending 02/02/2024 0531  Labs/Imaging Results for orders placed or performed during the hospital encounter of 01/10/24 (from the past 48 hours)  Brain natriuretic peptide     Status: Abnormal   Collection Time: 01/10/24 11:07 PM  Result Value Ref Range   B Natriuretic Peptide 216.6 (H) 0.0 - 100.0 pg/mL    Comment: Performed at [facility], [address], [place], [place] [address]  Comprehensive metabolic panel     Status: Abnormal   Collection Time: 01/10/24 11:31 PM  Result Value Ref Range   Sodium 135 135 - 145 mmol/L   Potassium 3.1 (L) 3.5 - 5.1 mmol/L   Chloride  98 98 - 111 mmol/L   CO2 17 (L) 22 - 32 mmol/L   Glucose, Bld 182 (H) 70 - 99 mg/dL    Comment: Glucose reference range applies only to samples taken after fasting for at least 8 hours.   BUN 52 (H) 8 - 23 mg/dL   Creatinine, Ser 1.61 (H) 0.44 - 1.00 mg/dL   Calcium 7.0 (L) 8.9 - 10.3 mg/dL   Total Protein 5.9 (L) 6.5 - 8.1 g/dL   Albumin 3.0 (L) 3.5 - 5.0 g/dL   AST 63 (H) 15 - 41 U/L   ALT 17 0 - 44 U/L   Alkaline Phosphatase 56 38 - 126 U/L   Total Bilirubin 0.6 0.0 - 1.2 mg/dL   GFR, Estimated 14 (L) >60 mL/min    Comment: (NOTE) Calculated using the CKD-EPI Creatinine Equation (2021)    Anion gap 20 (H) 5 - 15    Comment: Performed at  [facility], [address], [place], [place] [address]  CBC with Differential     Status: Abnormal   Collection Time: 01/10/24 11:31 PM  Result Value Ref Range   WBC 1.5 (L) 4.0 - 10.5 K/uL   RBC 4.12 3.87 - 5.11 MIL/uL   Hemoglobin 11.6 (L) 12.0 - 15.0 g/dL   HCT 54.0 98.1 - 19.1 %   MCV 88.6 80.0 - 100.0 fL   MCH 28.2 26.0 - 34.0 pg   MCHC 31.8 30.0 - 36.0 g/dL   RDW 47.8 29.5 - 62.1 %   Platelets 170 150 - 400 K/uL   nRBC 0.0 0.0 - 0.2 %   Neutrophils Relative % 44 %   Neutro Abs 0.8 (L) 1.7 - 7.7 K/uL   Band Neutrophils 9 %   Lymphocytes Relative 18 %   Lymphs Abs 0.3 (L) 0.7 - 4.0 K/uL   Monocytes Relative 2 %   Monocytes Absolute 0.0 (L) 0.1 - 1.0 K/uL   Eosinophils Relative 0 %   Eosinophils Absolute 0.0 0.0 - 0.5 K/uL   Basophils Relative 0 %   Basophils Absolute 0.0 0.0 - 0.1 K/uL   WBC Morphology Moderate Left Shift (>5% metas and myelos)    Metamyelocytes Relative 10 %   Myelocytes 17 %   Abs Immature Granulocytes 0.40 (H) 0.00 - 0.07 K/uL   Burr Cells PRESENT    Ovalocytes PRESENT     Comment: Performed at [facility], [address] [place], [place] [address]  Lipase, blood     Status: None   Collection Time: 01/10/24 11:31 PM  Result Value Ref Range   Lipase 37 11 - 51 U/L    Comment: Performed at [facility], [address], [place], [place] [address]  Troponin I (High Sensitivity)     Status: Abnormal   Collection Time: 01/10/24 11:31 PM  Result Value Ref Range   Troponin I (High Sensitivity) 26 (H) <18 ng/L    Comment: (NOTE) Elevated high sensitivity troponin I (hsTnI) values and significant  changes across serial measurements may suggest ACS but many other  chronic and acute conditions are known to elevate hsTnI results.  Refer to the "Links" section for chest pain algorithms and additional  guidance. Performed at [facility], [address] [place], [place]  [address]   Resp panel by RT-PCR (RSV, Flu A&B, Covid) Anterior Nasal Swab     Status: Abnormal   Collection Time: 01/10/24 11:37 PM   Specimen: Anterior Nasal Swab  Result Value Ref Range   SARS Coronavirus 2 by RT PCR NEGATIVE NEGATIVE    Comment: (NOTE) SARS-CoV-2 target nucleic acids are NOT DETECTED.  The SARS-CoV-2 RNA is generally detectable in upper respiratory specimens during the acute phase of infection. The lowest concentration of SARS-CoV-2 viral copies this assay can detect is 138 copies/mL. A negative result does not preclude SARS-Cov-2 infection and should not be used as the sole basis for treatment or other patient management decisions. A negative result may occur with  improper specimen collection/handling, submission of specimen other than nasopharyngeal swab, presence of viral mutation(s) within the areas targeted by this assay, and inadequate number of viral copies(<138 copies/mL). A negative result must be combined with clinical observations, patient history, and epidemiological information. The expected result is Negative.  Fact Sheet for Patients:  BloggerCourse.com  Fact Sheet for Healthcare Providers:  SeriousBroker.it  This test is no t yet approved or cleared by the [place] FDA and  has been authorized for detection and/or diagnosis of SARS-CoV-2 by FDA under an Emergency Use Authorization (EUA). This EUA will remain  in effect (meaning this test can be used) for the duration of the COVID-19 declaration under Section 564(b)(1) of the Act, 21 U.S.C.section 360bbb-3(b)(1), unless the authorization is terminated  or revoked sooner.       Influenza A by PCR POSITIVE (A) NEGATIVE   Influenza B by PCR NEGATIVE NEGATIVE    Comment: (NOTE) The Xpert Xpress SARS-CoV-2/FLU/RSV plus assay is intended as an aid in the diagnosis of influenza from Nasopharyngeal swab specimens and should not be used as a sole  basis for treatment. Nasal washings and aspirates are unacceptable for Xpert Xpress SARS-CoV-2/FLU/RSV testing.  Fact Sheet for Patients: BloggerCourse.com  Fact Sheet for Healthcare Providers: SeriousBroker.it  This test is not yet approved or cleared by the [place] FDA and has been authorized for detection and/or diagnosis of SARS-CoV-2 by FDA under an Emergency Use Authorization (EUA). This EUA will remain in effect (meaning this test can be used) for the duration of the COVID-19 declaration under Section 564(b)(1) of the Act, 21 U.S.C. section 360bbb-3(b)(1), unless the authorization is terminated or revoked.     Resp Syncytial Virus by PCR NEGATIVE NEGATIVE    Comment: (NOTE) Fact Sheet for Patients: BloggerCourse.com  Fact Sheet for Healthcare Providers: SeriousBroker.it  This test is not yet approved or cleared by the [place] FDA and has been authorized for detection and/or diagnosis of SARS-CoV-2 by FDA under an Emergency Use Authorization (EUA). This EUA will remain in effect (meaning this test can be used) for the duration of the COVID-19 declaration under Section 564(b)(1) of the Act, 21 U.S.C. section 360bbb-3(b)(1), unless the authorization is terminated or revoked.  Performed at [facility], [address], [place], [place] [address]   Protime-INR     Status: None   Collection Time: 01/10/24 11:55 PM  Result Value Ref Range   Prothrombin Time 14.7 11.4 - 15.2 seconds   INR 1.1 0.8 - 1.2    Comment: (NOTE) INR goal varies based on device and disease states. Performed at [facility], [address] [place], [place] [address]   APTT     Status: None   Collection Time: 01/10/24 11:55 PM  Result Value Ref Range   aPTT 31 24 - 36 seconds    Comment: Performed at [facility], [address] [place], [place] [address]  Urinalysis, w/ Reflex to Culture (Infection  Suspected) -Urine, Catheterized     Status: Abnormal   Collection Time: 01/22/2024 12:17 AM  Result Value Ref Range   Specimen Source URINE, CATHETERIZED    Color, Urine AMBER (A) YELLOW    Comment: BIOCHEMICALS MAY BE AFFECTED BY COLOR   APPearance CLOUDY (A) CLEAR   Specific Gravity, Urine 1.018 1.005 - 1.030   pH 5.0 5.0 - 8.0   Glucose, UA NEGATIVE NEGATIVE mg/dL   Hgb urine dipstick NEGATIVE NEGATIVE   Bilirubin Urine NEGATIVE NEGATIVE   Ketones, ur NEGATIVE NEGATIVE mg/dL   Protein, ur 604 (A) NEGATIVE mg/dL   Nitrite NEGATIVE NEGATIVE   Leukocytes,Ua NEGATIVE NEGATIVE   RBC / HPF 6-10 0 - 5 RBC/hpf   WBC, UA 6-10 0 - 5 WBC/hpf    Comment:        Reflex urine culture not performed if WBC <=10, OR if Squamous epithelial cells >5. If Squamous epithelial cells >5 suggest recollection.    Bacteria, UA RARE (A) NONE SEEN   Squamous Epithelial / HPF 0-5 0 - 5 /HPF   Mucus PRESENT    Hyaline Casts, UA PRESENT     Comment: Performed at [facility], [address], [place], [place] [address]  Rapid urine drug screen (hospital performed)     Status: Abnormal   Collection Time: 01/19/2024 12:17 AM  Result Value Ref Range   Opiates NONE DETECTED NONE DETECTED   Cocaine POSITIVE (A) NONE DETECTED   Benzodiazepines NONE DETECTED NONE DETECTED   Amphetamines POSITIVE (A) NONE DETECTED   Tetrahydrocannabinol POSITIVE (A) NONE DETECTED   Barbiturates NONE DETECTED NONE DETECTED    Comment: (NOTE) DRUG SCREEN FOR MEDICAL PURPOSES ONLY.  IF CONFIRMATION IS NEEDED FOR ANY PURPOSE, NOTIFY LAB WITHIN 5 DAYS.  LOWEST DETECTABLE LIMITS FOR URINE DRUG SCREEN Drug Class                     Cutoff (ng/mL) Amphetamine and metabolites    1000 Barbiturate and metabolites    200 Benzodiazepine                 200 Opiates and metabolites        300 Cocaine and metabolites        300 THC                             50 Performed at [facility], [address] [place], [place] [address]   Blood gas, arterial (at [facility])     Status: Abnormal   Collection Time: 01/16/2024 12:31 AM  Result Value Ref Range   FIO2 100 %   Mode PRESSURE REGULATED VOLUME CONTROL    MECHVT 420 mL   RATE 20 resp/min   PEEP 5 cm H20   pH, Arterial 7.2 (L) 7.35 - 7.45   pCO2 arterial 53 (H) 32 - 48 mmHg   pO2, Arterial 303 (H) 83 - 108 mmHg   Bicarbonate 20.7 20.0 - 28.0 mmol/L   Acid-base deficit 7.7 (H) 0.0 - 2.0 mmol/L   O2 Saturation 100 %   Patient temperature 37.0    Collection site GROIN    Drawn by COLLECTED BY DOCTOR    Allens test (pass/fail) PASS PASS    Comment: Performed at [facility], [address] [place], [place] [address]  Blood Culture (routine x 2)     Status: None (Preliminary result)  Collection Time: 01/17/2024  1:00 AM   Specimen: BLOOD RIGHT ARM  Result Value Ref Range   Specimen Description      BLOOD RIGHT ARM Performed at [facility], [address] [place], [place] [address]    Special Requests      BOTTLES DRAWN AEROBIC AND ANAEROBIC Blood Culture results may not be optimal due to an inadequate volume of blood received in culture bottles Performed at [facility], [address], [place], [place] [address]    Culture PENDING    Report Status PENDING   I-Stat Lactic Acid, ED     Status: Abnormal   Collection Time: 02/06/2024  1:05 AM  Result Value Ref Range   Lactic Acid, Venous 2.5 (HH) 0.5 - 1.9 mmol/L   Comment NOTIFIED PHYSICIAN   Blood Culture (routine x 2)     Status: None (Preliminary result)   Collection Time: 01/22/2024  1:19 AM   Specimen: BLOOD RIGHT ARM  Result Value Ref Range   Specimen Description      BLOOD RIGHT ARM Performed at [facility], [address], [place], [place] [address]    Special Requests      BOTTLES DRAWN AEROBIC ONLY Blood Culture adequate  volume Performed at [facility], [address] [place], [place] [address]    Culture PENDING    Report Status PENDING   Troponin I (High Sensitivity)     Status: Abnormal   Collection Time: 01/22/2024  2:15 AM  Result Value Ref Range   Troponin I (High Sensitivity) 31 (H) <18 ng/L    Comment: (NOTE) Elevated high sensitivity troponin I (hsTnI) values and significant  changes across serial measurements may suggest ACS but many other  chronic and acute conditions are known to elevate hsTnI results.  Refer to the "Links" section for chest pain algorithms and additional  guidance. Performed at [facility], [address], [place], [place] [address]   I-Stat Lactic Acid, ED     Status: Abnormal   Collection Time: 01/15/2024  2:23 AM  Result Value Ref Range   Lactic Acid, Venous 3.6 (HH) 0.5 - 1.9 mmol/L   Comment NOTIFIED PHYSICIAN    DG Chest Port 1 View Result Date: 01/13/2024 CLINICAL DATA:  Intubation EXAM: PORTABLE CHEST 1 VIEW COMPARISON:  01/10/2024 FINDINGS: Endotracheal tube present with tip measuring 4.2 cm above the carina. Enteric tube is present. Tip projects over the midline in the abdominopelvic junction likely in the distal stomach. Heart size is normal. Patchy perihilar and basilar infiltrates, greater on the left. Similar appearance to previous study. This is likely multifocal pneumonia or possibly edema. No pleural effusions. No pneumothorax. Mediastinal contours appear intact. Calcification of the aorta. Surgical clips in the right upper quadrant. IMPRESSION: 1. Appliances appear in satisfactory position. 2. Patchy infiltrates in the lungs likely representing multifocal pneumonia or possibly edema. Electronically Signed   By: Burman Nieves M.D.   On: 02/03/2024 01:38   DG Chest Port 1 View Result Date: 01/10/2024 CLINICAL DATA:  Cough for 2 weeks with respiratory distress. EXAM: PORTABLE CHEST 1 VIEW COMPARISON:  10/20/2023 FINDINGS:  Stable cardiomediastinal silhouette. Aortic atherosclerotic calcification. Patchy bilateral airspace opacities suggesting multifocal pneumonia. No pleural effusion or pneumothorax. IMPRESSION: Patchy bilateral airspace opacities suggesting multifocal pneumonia. Electronically Signed   By: Minerva Fester M.D.   On: 01/10/2024 23:55    Pending Labs [employer] (From admission, onward)     Start  Ordered   01/24/2024 0600  Blood gas, arterial  Once,   R        01/17/2024 0311   01/28/2024 0500  CBC  Tomorrow morning,   R        01/15/2024 0254   02/01/2024 0500  Basic metabolic panel  Tomorrow morning,   R        01/20/2024 0254   01/21/2024 0500  Lactic acid, plasma  (Lactic Acid)  Tomorrow morning,   R        02/02/2024 0442   01/20/2024 0451  MRSA Next Gen by PCR, Nasal  Once,   R        01/18/2024 0450   01/25/2024 0313  Magnesium  (ICU Tube Feeding: PEPuP )  5A & 5P,   R (with TIMED occurrences)      01/29/2024 0312   01/24/2024 0313  Phosphorus  (ICU Tube Feeding: PEPuP )  5A & 5P,   R (with TIMED occurrences)      01/23/2024 0312   01/26/2024 0311  Culture, Respiratory w Gram Stain  Once,   R        02/05/2024 0311   01/31/2024 0252  Creatinine, serum  (heparin)  Once,   R       Comments: Baseline for heparin therapy IF NOT ALREADY DRAWN.    01/17/2024 0254   01/17/2024 0022  HIV Antibody (routine testing w rflx)  (HIV Antibody (Routine testing w reflex) panel)  Once,   URGENT        01/17/2024 0021            Vitals/Pain Today's Vitals   01/23/2024 0453 01/25/2024 0455 01/24/2024 0500 02/04/2024 0525  BP:   (!) 87/68 (!) 89/58  Pulse:  (!) 103 (!) 104   Resp:  (!) 23 (!) 22 20  Temp:  (!) 101.8 F (38.8 C) (!) 101.7 F (38.7 C) (!) 101.5 F (38.6 C)  TempSrc:      SpO2: 90% 91% 92%   Height:        Isolation Precautions No active isolations  Medications Medications  propofol (DIPRIVAN) 1000 MG/100ML infusion (20 mcg/kg/min  39.5 kg (Order-Specific) Intravenous Rate/Dose Change 01/12/2024 0228)   polyethylene glycol (MIRALAX / GLYCOLAX) packet 17 g (has no administration in time range)  heparin injection 5,000 Units (has no administration in time range)  docusate (COLACE) 50 MG/5ML liquid 100 mg (has no administration in time range)  doxycycline (VIBRAMYCIN) 100 mg in sodium chloride 0.9 % 250 mL IVPB (100 mg Intravenous New Bag/Given 01/31/2024 0520)  cefTRIAXone (ROCEPHIN) 1 g in sodium chloride 0.9 % 100 mL IVPB (has no administration in time range)  feeding supplement (PIVOT 1.5 CAL) liquid 1,000 mL (has no administration in time range)  feeding supplement (PROSource TF20) liquid 60 mL (has no administration in time range)  famotidine (PEPCID) 40 MG/5ML suspension 20 mg (has no administration in time range)  lactated ringers infusion ( Intravenous New Bag/Given 01/20/2024 0425)  oseltamivir (TAMIFLU) 6 MG/ML suspension 30 mg (has no administration in time range)  acetaminophen (TYLENOL) tablet 650 mg (650 mg Per Tube Given 01/25/2024 0438)  Chlorhexidine Gluconate Cloth 2 % PADS 6 each (has no administration in time range)  Oral care mouth rinse (has no administration in time range)  Oral care mouth rinse (has no administration in time range)  fentaNYL (SUBLIMAZE) injection 50 mcg (50 mcg Intravenous Given 01/10/24 2310)  etomidate (AMIDATE) injection 20 mg (20 mg Intravenous Given 01/10/24 2354)  succinylcholine (ANECTINE) syringe 100 mg (100 mg Intravenous Not Given 01/19/2024 0255)  lactated ringers bolus 1,000 mL (0 mLs Intravenous Stopped 01/22/2024 0209)  vancomycin (VANCOCIN) IVPB 1000 mg/200 mL premix (0 mg Intravenous Stopped 01/27/2024 0223)  ceFEPIme (MAXIPIME) 2 g in sodium chloride 0.9 % 100 mL IVPB (0 g Intravenous Stopped 01/15/2024 0110)  lactated ringers bolus 1,000 mL (0 mLs Intravenous Stopped 02/04/2024 0324)  lactated ringers bolus 500 mL (0 mLs Intravenous Stopped 01/12/2024 0515)    Mobility      Focused Assessments    R Recommendations: See Admitting Provider Note  Report given to:    Additional Notes:

## 2024-02-07 NOTE — Progress Notes (Signed)
Pt transferred from ED on the vent with RN without any complications. Placed back on the previous settings.

## 2024-02-07 NOTE — Sepsis Progress Note (Signed)
 Following for sepsis monitoring ?

## 2024-02-07 DEATH — deceased
